# Patient Record
Sex: Male | Born: 1960 | Race: White | Hispanic: No | Marital: Married | Smoking: Current some day smoker
Health system: Southern US, Community
[De-identification: ages and names within clinical notes are randomized; demographics above are authoritative.]

## PROBLEM LIST (undated history)

## (undated) DIAGNOSIS — E669 Obesity, unspecified: Secondary | ICD-10-CM

## (undated) DIAGNOSIS — G4730 Sleep apnea, unspecified: Secondary | ICD-10-CM

## (undated) DIAGNOSIS — M1990 Unspecified osteoarthritis, unspecified site: Secondary | ICD-10-CM

## (undated) DIAGNOSIS — N200 Calculus of kidney: Secondary | ICD-10-CM

## (undated) DIAGNOSIS — I4892 Unspecified atrial flutter: Secondary | ICD-10-CM

## (undated) DIAGNOSIS — Z87442 Personal history of urinary calculi: Secondary | ICD-10-CM

## (undated) DIAGNOSIS — L309 Dermatitis, unspecified: Secondary | ICD-10-CM

## (undated) DIAGNOSIS — Z227 Latent tuberculosis: Secondary | ICD-10-CM

## (undated) HISTORY — PX: LITHOTRIPSY: SUR834

## (undated) HISTORY — PX: HEMANGIOMA EXCISION: SHX1734

## (undated) HISTORY — DX: Calculus of kidney: N20.0

## (undated) HISTORY — DX: Latent tuberculosis: Z22.7

## (undated) HISTORY — DX: Obesity, unspecified: E66.9

## (undated) HISTORY — PX: COLONOSCOPY: SHX174

## (undated) HISTORY — PX: CATARACT EXTRACTION: SUR2

## (undated) HISTORY — DX: Dermatitis, unspecified: L30.9

## (undated) HISTORY — DX: Unspecified atrial flutter: I48.92

---

## 1997-02-16 HISTORY — PX: LAMINECTOMY AND MICRODISCECTOMY LUMBAR SPINE: SHX1913

## 2009-12-30 ENCOUNTER — Encounter (INDEPENDENT_AMBULATORY_CARE_PROVIDER_SITE_OTHER): Payer: Self-pay | Admitting: *Deleted

## 2010-01-07 ENCOUNTER — Encounter (INDEPENDENT_AMBULATORY_CARE_PROVIDER_SITE_OTHER): Payer: Self-pay | Admitting: *Deleted

## 2010-01-08 ENCOUNTER — Ambulatory Visit: Payer: Self-pay | Admitting: Gastroenterology

## 2010-01-20 ENCOUNTER — Ambulatory Visit: Payer: Self-pay | Admitting: Gastroenterology

## 2010-01-23 ENCOUNTER — Encounter: Payer: Self-pay | Admitting: Gastroenterology

## 2010-03-18 NOTE — Procedures (Signed)
Summary: Colonoscopy  Patient: [name] Note: All result statuses are Final unless otherwise noted.  Tests: (1) Colonoscopy (COL)   COL Colonoscopy           DONE     [facility]     [address]     [place], [place]  [address]           COLONOSCOPY PROCEDURE REPORT           PATIENT:  Daniel, Anderson  MR#:  [id number]     BIRTHDATE:  1961-02-02, 49 yrs. old  GENDER:  male     ENDOSCOPIST:  Vania Rea. Jarold Motto, MD, [facility]     REF. BY:  Kari Baars, M.D.     PROCEDURE DATE:  01/20/2010     PROCEDURE:  Colonoscopy with snare polypectomy     ASA CLASS:  Class I     INDICATIONS:  Routine Risk Screening     MEDICATIONS:   Fentanyl 50 mcg IV, Versed 7 mg IV           DESCRIPTION OF PROCEDURE:   After the risks benefits and     alternatives of the procedure were thoroughly explained, informed     consent was obtained.  Digital rectal exam was performed and     revealed no abnormalities.   The LB 180AL [id number] endoscope was     introduced through the anus and advanced to the cecum, which was     identified by both the appendix and ileocecal valve, without     limitations.  The quality of the prep was excellent, using     MoviPrep.  The instrument was then slowly withdrawn as the colon     was fully examined.     <<PROCEDUREIMAGES>>           FINDINGS:  Mild diverticulosis was found in the sigmoid to     descending colon segments.  A sessile polyp was found in the     sigmoid colon. FLAT BENIGN POLYP COLD SNARE EXCISED.     Retroflexed views in the rectum revealed no abnormalities.    The     scope was then withdrawn from the patient and the procedure     completed.           COMPLICATIONS:  None     ENDOSCOPIC IMPRESSION:     1) Mild diverticulosis in the sigmoid to descending colon     segments     2) Sessile polyp in the sigmoid colon     R/O ADENOMA.     RECOMMENDATIONS:     1) high fiber diet     2) Repeat colonoscopy in 5 years if polyp adenomatous;  otherwise     10 years     REPEAT EXAM:  No           ______________________________     Vania Rea. Jarold Motto, MD, [facility]           CC:           n.     eSIGNED:   Vania Rea. Patterson at 01/20/2010 03:46 PM           Jarnigan, Epifania Gore, [id number]  Note: An exclamation mark (!) indicates a result that was not dispersed into the flowsheet. Document Creation Date: 01/20/2010 3:46 PM _______________________________________________________________________  (1) Order result status: Final Collection or observation date-time: 01/20/2010 15:38 Requested date-time:  Receipt date-time:  Reported date-time:  Referring Physician:  Ordering Physician: Sheryn Bison [phone]) Specimen Source:  Source: Launa Grill Order Number: [id number] Lab site:   Appended Document: Colonoscopy ten-year followup  Appended Document: Colonoscopy     Procedures Next Due Date:    Colonoscopy: 01/2020

## 2010-03-18 NOTE — Letter (Signed)
Summary: Pre Visit Letter Revised  [facility]  [address] [place], [place] [address]   Phone: [phone]  Fax: [phone]        12/30/2009 MRN: [id number] [name] [address] [place], [place]  [address]             Procedure Date:  01/20/2010   Welcome to the Gastroenterology Division at [facility].    You are scheduled to see a nurse for your pre-procedure visit on 01/08/2010 at 10:00AM on the 3rd floor at [facility], [address]  We ask that you try to arrive at our office 15 minutes prior to your appointment time to allow for check-in.  Please take a minute to review the attached form.  If you answer "Yes" to one or more of the questions on the first page, we ask that you call the person listed at your earliest opportunity.  If you answer "No" to all of the questions, please complete the rest of the form and bring it to your appointment.    Your nurse visit will consist of discussing your medical and surgical history, your immediate family medical history, and your medications.   If you are unable to list all of your medications on the form, please bring the medication bottles to your appointment and we will list them.  We will need to be aware of both prescribed and over the counter drugs.  We will need to know exact dosage information as well.    Please be prepared to read and sign documents such as consent forms, a financial agreement, and acknowledgement forms.  If necessary, and with your consent, a friend or relative is welcome to sit-in on the nurse visit with you.  Please bring your insurance card so that we may make a copy of it.  If your insurance requires a referral to see a specialist, please bring your referral form from your primary care physician.  No co-pay is required for this nurse visit.     If you cannot keep your appointment, please call [phone] to cancel or reschedule prior to your appointment date.  This  allows [place] the opportunity to schedule an appointment for another patient in need of care.    Thank you for choosing [facility] for your medical needs.  We appreciate the opportunity to care for you.  Please visit [place] at our website  to learn more about our practice.  Sincerely, The Gastroenterology Division

## 2010-03-18 NOTE — Letter (Signed)
Summary: Patient Notice- Polyp Results  [facility]  [address] [place], [place] [address]   Phone: [phone]  Fax: [phone]        January 23, 2010 MRN: [id number]    [name] [address] [place], [place]  [address]    Dear [name],  I am pleased to inform you that the colon polyp(s) removed during your recent colonoscopy was (were) found to be benign (no cancer detected) upon pathologic examination.  I recommend you have a repeat colonoscopy examination in 10_ years to look for recurrent polyps, as having colon polyps increases your risk for having recurrent polyps or even colon cancer in the future.Your polyp was a hyperplastic polyp and not and adenoma.  Should you develop new or worsening symptoms of abdominal pain, bowel habit changes or bleeding from the rectum or bowels, please schedule an evaluation with either your primary care physician or with me.  Additional information/recommendations:  xx__ No further action with gastroenterology is needed at this time. Please      follow-up with your primary care physician for your other healthcare      needs.If You have any gastrointestinal problems, please feel free to call me at your convenience.  __ Please call [phone] to schedule a return visit to review your      situation.  __ Please keep your follow-up visit as already scheduled.  __ Continue treatment plan as outlined the day of your exam.  Please call us if you are having persistent problems or have questions about your condition that have not been fully answered at this time.  Sincerely,  Mardella Layman MD [facility]  This letter has been electronically signed by your physician.  Appended Document: Patient Notice- Polyp Results Letter mailed

## 2010-03-18 NOTE — Miscellaneous (Signed)
Summary: DIR COL...AS.  Clinical Lists Changes  Medications: Added new medication of MOVIPREP 100 GM  SOLR (PEG-KCL-NACL-NASULF-NA ASC-C) As per prep instructions. - Signed Rx of MOVIPREP 100 GM  SOLR (PEG-KCL-NACL-NASULF-NA ASC-C) As per prep instructions.;  #1 x 0;  Signed;  Entered by: Clide Cliff RN;  Authorized by: Mardella Layman MD [facility];  Method used: Electronically to [facility]. [place]. [phone]*, [address] [place], [place], [place]  [address], Ph: [phone] or [phone], Fax: [phone] Observations: Added new observation of ALLERGY REV: Done (01/08/2010 9:43)    Prescriptions: MOVIPREP 100 GM  SOLR (PEG-KCL-NACL-NASULF-NA ASC-C) As per prep instructions.  #1 x 0   Entered by:   Clide Cliff RN   Authorized by:   Mardella Layman MD [facility]   Signed by:   Clide Cliff RN on 01/08/2010   Method used:   Electronically to        [facility]. [address] [phone]* (retail)       [address]       [place], [place]  [address]       Ph: [phone] or [phone]       Fax: [phone]   RxID:   [id number]

## 2010-03-18 NOTE — Letter (Signed)
Summary: [facility] Instructions  [facility]  [address] [place], [place] [address]   Phone: [phone]  Fax: [phone]       MAYSEN SUDOL    1960/12/25    MRN: [id number]        Procedure Day /Date:  Monday 01/20/2010     Arrival Time: 2:30 pm      Procedure Time: 3:30 pm     Location of Procedure:                    _ x_  [facility] (4th Floor)                        PREPARATION FOR COLONOSCOPY WITH MOVIPREP   Starting 5 days prior to your procedure Wednesday 11/30 do not eat nuts, seeds, popcorn, corn, beans, peas,  salads, or any raw vegetables.  Do not take any fiber supplements (e.g. Metamucil, Citrucel, and Benefiber).  THE DAY BEFORE YOUR PROCEDURE         DATE: Sunday 12/4 1.  Drink clear liquids the entire day-NO SOLID FOOD  2.  Do not drink anything colored red or purple.  Avoid juices with pulp.  No orange juice.  3.  Drink at least 64 oz. (8 glasses) of fluid/clear liquids during the day to prevent dehydration and help the prep work efficiently.  CLEAR LIQUIDS INCLUDE: Water Jello Ice Popsicles Tea (sugar ok, no milk/cream) Powdered fruit flavored drinks Coffee (sugar ok, no milk/cream) Gatorade Juice: apple, white grape, white cranberry  Lemonade Clear bullion, consomm, broth Carbonated beverages (any kind) Strained chicken noodle soup Hard Candy                             4.  In the morning, mix first dose of MoviPrep solution:    Empty 1 Pouch A and 1 Pouch B into the disposable container    Add lukewarm drinking water to the top line of the container. Mix to dissolve    Refrigerate (mixed solution should be used within 24 hrs)  5.  Begin drinking the prep at 5:00 p.m. The MoviPrep container is divided by 4 marks.   Every 15 minutes drink the solution down to the next mark (approximately 8 oz) until the full liter is complete.   6.  Follow completed prep with 16 oz of clear liquid of your choice (Nothing  red or purple).  Continue to drink clear liquids until bedtime.  7.  Before going to bed, mix second dose of MoviPrep solution:    Empty 1 Pouch A and 1 Pouch B into the disposable container    Add lukewarm drinking water to the top line of the container. Mix to dissolve    Refrigerate  THE DAY OF YOUR PROCEDURE      DATE: Monday 12/5  Beginning at 10:30 a.m. (5 hours before procedure):         1. Every 15 minutes, drink the solution down to the next mark (approx 8 oz) until the full liter is complete.  2. Follow completed prep with 16 oz. of clear liquid of your choice.    3. You may drink clear liquids until 1:30 pm (2 HOURS BEFORE PROCEDURE).   MEDICATION INSTRUCTIONS  Unless otherwise instructed, you should take regular prescription medications with a small sip of water   as early as possible the morning of your  procedure.           OTHER INSTRUCTIONS  You will need a responsible adult at least 51 years of age to accompany you and drive you home.   This person must remain in the waiting room during your procedure.  Wear loose fitting clothing that is easily removed.  Leave jewelry and other valuables at home.  However, you may wish to bring a book to read or  an iPod/MP3 player to listen to music as you wait for your procedure to start.  Remove all body piercing jewelry and leave at home.  Total time from sign-in until discharge is approximately 2-3 hours.  You should go home directly after your procedure and rest.  You can resume normal activities the  day after your procedure.  The day of your procedure you should not:   Drive   Make legal decisions   Operate machinery   Drink alcohol   Return to work  You will receive specific instructions about eating, activities and medications before you leave.    The above instructions have been reviewed and explained to me by   Clide Cliff, RN_____________________    I fully understand and can  verbalize these instructions _____________________________ Date _________

## 2013-04-17 ENCOUNTER — Other Ambulatory Visit: Payer: Self-pay | Admitting: Dermatology

## 2016-10-05 ENCOUNTER — Ambulatory Visit: Payer: Self-pay | Admitting: Infectious Diseases

## 2016-10-05 ENCOUNTER — Ambulatory Visit: Payer: 59 | Admitting: Infectious Diseases

## 2016-11-11 ENCOUNTER — Ambulatory Visit (INDEPENDENT_AMBULATORY_CARE_PROVIDER_SITE_OTHER): Payer: 59 | Admitting: Infectious Diseases

## 2016-11-11 ENCOUNTER — Encounter: Payer: Self-pay | Admitting: Infectious Diseases

## 2016-11-11 ENCOUNTER — Ambulatory Visit
Admission: RE | Admit: 2016-11-11 | Discharge: 2016-11-11 | Disposition: A | Discharge status: Home / Self Care | Payer: 59 | Source: Ambulatory Visit | Attending: Infectious Diseases | Admitting: Infectious Diseases

## 2016-11-11 VITALS — BP 135/82 | HR 62 | Temp 97.5°F | Ht 70.0 in | Wt 226.0 lb

## 2016-11-11 DIAGNOSIS — R7611 Nonspecific reaction to tuberculin skin test without active tuberculosis: Secondary | ICD-10-CM

## 2016-11-11 DIAGNOSIS — Z227 Latent tuberculosis: Secondary | ICD-10-CM

## 2016-11-11 NOTE — Progress Notes (Signed)
   Subjective:    Patient ID: [name], male    DOB: Aug 06, 1960, 56 y.o.   MRN: [id number]  HPI 56 yo M with hx +PPD 1983 (after deployment in [place]), hyperlipidemia. He had a CXR (-) at that time and subsequently. He is retired from police force.  No hx of TB exposure. Mother with prev PPD+.  Can't remember last CXR (last within 3 years).   The past medical history, family history and social history were reviewed/updated in EPIC   Review of Systems  Constitutional: Negative for appetite change, chills, fever and unexpected weight change.  Respiratory: Negative for cough and shortness of breath.   Cardiovascular: Negative for chest pain.  Gastrointestinal: Negative for constipation and diarrhea.  Genitourinary: Negative for difficulty urinating.  Neurological: Negative for headaches.  Hematological: Negative for adenopathy.  no night sweats Please see HPI. 12 point ROS o/w (-)     Objective:   Physical Exam  Constitutional: He appears well-developed and well-nourished.  HENT:  Mouth/Throat: No oropharyngeal exudate.  Eyes: Pupils are equal, round, and reactive to light. EOM are normal.  Neck: Neck supple.  Cardiovascular: Normal rate, regular rhythm and normal heart sounds.   Pulmonary/Chest: Effort normal and breath sounds normal.  Abdominal: Soft. Bowel sounds are normal. There is no tenderness. There is no rebound.  Musculoskeletal: He exhibits no edema.  Lymphadenopathy:    He has no cervical adenopathy.    He has no axillary adenopathy.       Right: No supraclavicular adenopathy present.       Left: No supraclavicular adenopathy present.      Assessment & Plan:

## 2016-12-21 ENCOUNTER — Encounter: Payer: Self-pay | Admitting: Infectious Diseases

## 2016-12-21 ENCOUNTER — Ambulatory Visit (INDEPENDENT_AMBULATORY_CARE_PROVIDER_SITE_OTHER): Payer: No Typology Code available for payment source | Admitting: Infectious Diseases

## 2016-12-21 VITALS — BP 158/75 | HR 66 | Temp 98.4°F | Wt 228.0 lb

## 2016-12-21 DIAGNOSIS — R7611 Nonspecific reaction to tuberculin skin test without active tuberculosis: Secondary | ICD-10-CM | POA: Diagnosis not present

## 2016-12-21 DIAGNOSIS — Z227 Latent tuberculosis: Secondary | ICD-10-CM

## 2016-12-21 LAB — COMPREHENSIVE METABOLIC PANEL
AG RATIO: 1.6 (calc) (ref 1.0–2.5)
ALBUMIN MSPROF: 4.2 g/dL (ref 3.6–5.1)
ALKALINE PHOSPHATASE (APISO): 78 U/L (ref 40–115)
ALT: 33 U/L (ref 9–46)
AST: 21 U/L (ref 10–35)
BILIRUBIN TOTAL: 0.5 mg/dL (ref 0.2–1.2)
BUN: 23 mg/dL (ref 7–25)
CALCIUM: 9.2 mg/dL (ref 8.6–10.3)
CHLORIDE: 106 mmol/L (ref 98–110)
CO2: 30 mmol/L (ref 20–32)
Creat: 1 mg/dL (ref 0.70–1.33)
GLOBULIN: 2.6 g/dL (ref 1.9–3.7)
Glucose, Bld: 104 mg/dL — ABNORMAL HIGH (ref 65–99)
POTASSIUM: 4.7 mmol/L (ref 3.5–5.3)
SODIUM: 142 mmol/L (ref 135–146)
TOTAL PROTEIN: 6.8 g/dL (ref 6.1–8.1)

## 2016-12-21 MED ORDER — RIFAMPIN 300 MG PO CAPS: 600.0000 mg | ORAL_CAPSULE | Freq: Every day | ORAL | 1 refills | Status: DC

## 2016-12-21 NOTE — Progress Notes (Signed)
   Subjective:    Patient ID: [name], male    DOB: 1961-01-04, 56 y.o.   MRN: [id number]  HPI  56 yo M with hx +PPD 1983 (after deployment in [place]), hyperlipidemia. He had a CXR (-) at that time and subsequently. He is retired from police force.  No hx of TB exposure. Mother with prev PPD+.  Can't remember last CXR (last within 3 years).  He was seen in ID on 9-26 and was equivocal about starting rx for LTBI.  He had CXR (-) at f/u at that time.  He wants to start LTBI rx after first of year.    Review of Systems  Constitutional: Negative for appetite change, chills, fever and unexpected weight change.  Respiratory: Negative for cough and shortness of breath.   Please see HPI. All other systems reviewed and negative.      Objective:   Physical Exam  Constitutional: He appears well-developed and well-nourished.          Assessment & Plan:

## 2017-04-21 ENCOUNTER — Encounter: Payer: Self-pay | Admitting: Infectious Diseases

## 2017-04-21 ENCOUNTER — Ambulatory Visit (INDEPENDENT_AMBULATORY_CARE_PROVIDER_SITE_OTHER): Payer: No Typology Code available for payment source | Admitting: Infectious Diseases

## 2017-04-21 DIAGNOSIS — R7611 Nonspecific reaction to tuberculin skin test without active tuberculosis: Secondary | ICD-10-CM | POA: Diagnosis not present

## 2017-04-21 DIAGNOSIS — Z227 Latent tuberculosis: Secondary | ICD-10-CM | POA: Insufficient documentation

## 2017-04-21 NOTE — Assessment & Plan Note (Addendum)
He is doing well after completing first month of rifampin.  He has no sx of liver dysfunction. Will defer checking his LFTs today. We discussed this.  He is aware of s/s of liver dysfunction.  Explained that his PPD/Quantiferon is unlikely to go back to negative and that if he needs fiurther testing after an exposure, he will need a CXR.  rtc prn.

## 2017-04-21 NOTE — Progress Notes (Signed)
   Subjective:    Patient ID: [name], male    DOB: 1960/08/02, 57 y.o.   MRN: [id number]  HPI 57 yo M with hx +PPD 1983 (after deployment in [place]), hyperlipidemia. He had a CXR (-) at that time and subsequently. He is retired from police force.  No hx of TB exposure. Mother with prev PPD+.  Can't remember last CXR (last within 3 years). He was seen in ID on 9-26 and was equivocal about starting rx for LTBI.  He had CXR (-) at f/u at that time.   Has been doing well, has finnished first month of rifampin.  Only noticed that 1 hr later his urine is off color but otherwise goes back to normal.  No other sfx.   Review of Systems  Constitutional: Negative for appetite change and unexpected weight change.  Gastrointestinal: Negative for abdominal distention, abdominal pain, constipation, diarrhea, nausea and vomiting.  no change in color of stool, or change in color of eyes.       Objective:   Physical Exam  Constitutional: He appears well-developed and well-nourished.  HENT:  Mouth/Throat: No oropharyngeal exudate.  Eyes: EOM are normal. Pupils are equal, round, and reactive to light.  Neck: Neck supple.  Cardiovascular: Normal rate, regular rhythm and normal heart sounds.  Pulmonary/Chest: Effort normal and breath sounds normal.  Abdominal: Soft. Bowel sounds are normal. There is no tenderness. There is no rebound.  Musculoskeletal: He exhibits no edema.  Lymphadenopathy:    He has no cervical adenopathy.       Assessment & Plan:

## 2017-06-10 ENCOUNTER — Other Ambulatory Visit: Payer: Self-pay

## 2017-06-10 ENCOUNTER — Telehealth: Payer: Self-pay

## 2017-06-10 NOTE — Telephone Encounter (Signed)
Refill request for Rifampin came in on fax. Please advise. Pola Corn, LPN

## 2017-06-10 NOTE — Telephone Encounter (Signed)
Please refill  thanks

## 2018-01-31 ENCOUNTER — Ambulatory Visit (INDEPENDENT_AMBULATORY_CARE_PROVIDER_SITE_OTHER): Payer: No Typology Code available for payment source

## 2018-01-31 ENCOUNTER — Encounter (INDEPENDENT_AMBULATORY_CARE_PROVIDER_SITE_OTHER): Payer: Self-pay | Admitting: Orthopaedic Surgery

## 2018-01-31 ENCOUNTER — Ambulatory Visit (INDEPENDENT_AMBULATORY_CARE_PROVIDER_SITE_OTHER): Payer: No Typology Code available for payment source | Admitting: Orthopaedic Surgery

## 2018-01-31 DIAGNOSIS — M25551 Pain in right hip: Secondary | ICD-10-CM

## 2018-01-31 DIAGNOSIS — M5431 Sciatica, right side: Secondary | ICD-10-CM

## 2018-01-31 MED ORDER — METHYLPREDNISOLONE ACETATE 40 MG/ML IJ SUSP: 40.0000 mg | Freq: Once | INTRAMUSCULAR | Status: DC

## 2018-01-31 NOTE — Progress Notes (Signed)
[name] comes in today for evaluation treatment of right hip pain.  He says both hips are stiff the right is been hurting in the back of the hip and in the groin.  There is some radiation down to the knee but nothing past the knee into the foot or ankle.  He had some stiffness in his back as well.  This is been slowly getting worse over time but the last for 5 weeks it is been more significantly worse in terms of pain in the groin without any known injury.  He has been very active all his life.  We have performed surgery on the shoulder before and injected his shoulder which he got significant relief from.  He says both hips are stiff but the right is worse than left.  There is a history of osteoarthritis in his family and siblings and parents have had joint replacements.  He has more problems getting out of a car and pivoting and putting on his shoe and sock on the right side.  On exam both hips have significant stiffness with internal X rotation.  The right hip has more pain in the groin and around the side of the hip with internal extra rotation and there is no pain over the trochanteric area to palpation.  He has negative straight leg raise bilaterally and excellent motor function and sensory function in both lower extremities.  AP pelvis and lateral of his right hip does show evidence of femoral acetabular impingement.  There are particular osteophytes as well as a humpback deformity of the femoral head neck area on the right side and somewhat on the left side.  There is superior lateral joint space narrowing and sclerotic changes in the acetabulum.  2 views of lumbar spine show arthritic changes at several levels but no acute findings.  I have talked to my partner Dr. Junius Roads who is proficient ultrasound about seeing the patient today for an ultrasound-guided intra-articular injection in the right hip.  We will see him back in 4 weeks to see if this is helped from a therapeutic and diagnostic standpoint.   All question concerns were answered and addressed.

## 2018-01-31 NOTE — Progress Notes (Signed)
Subjective: He is here for ultrasound-guided right hip injection.  He has femoral acetabular impingement.  Pain mainly with walking, but in the past few weeks it has been hurting fairly constantly.  Objective: Limited passive range of motion with internal rotation in both hips.  Mild pain on the right.  Tender near the posterior greater trochanter as well.  Procedure: Ultrasound-guided right hip injection: After sterile prep with Betadine, injected 8 cc 1% lidocaine without epinephrine and 40 mg methylprednisolone using a 22-gauge spinal needle, passing the needle through the iliofemoral ligament into the femoral head/neck junction.  Injectate was seen filling the joint capsule.  Patient had good immediate pain relief during the anesthetic phase.  He will follow-up in 4 weeks with Dr. [name].

## 2018-02-28 ENCOUNTER — Ambulatory Visit (INDEPENDENT_AMBULATORY_CARE_PROVIDER_SITE_OTHER): Payer: No Typology Code available for payment source | Admitting: Orthopaedic Surgery

## 2018-02-28 ENCOUNTER — Encounter (INDEPENDENT_AMBULATORY_CARE_PROVIDER_SITE_OTHER): Payer: Self-pay | Admitting: Orthopaedic Surgery

## 2018-02-28 DIAGNOSIS — M5431 Sciatica, right side: Secondary | ICD-10-CM

## 2018-02-28 DIAGNOSIS — M25551 Pain in right hip: Secondary | ICD-10-CM | POA: Diagnosis not present

## 2018-02-28 NOTE — Progress Notes (Signed)
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  He says the injection gave him significant relief.  He still has some pain with certain motions but overall he is feeling better.  On exam he does still show evidence of femoral sterile impingement with pain in the groin on extremes of internal and external rotation of his right hip.  This point we will follow-up as needed.  We talked in detail about avoiding high impact aerobic activities.  He can always consider a repeat steroid injection but not for least 5 to 6 months.  The other possibility would be hip replacement if the pain gets to where it is detrimentally affecting his mobility, his quality of life, and his activities daily living on a daily basis.  That is when it would be time to have a hip replacement.  All question concerns were answered and addressed.  Follow-up otherwise be as needed.

## 2018-11-28 ENCOUNTER — Other Ambulatory Visit: Payer: Self-pay

## 2018-11-28 ENCOUNTER — Ambulatory Visit (INDEPENDENT_AMBULATORY_CARE_PROVIDER_SITE_OTHER): Payer: No Typology Code available for payment source | Admitting: Orthopedic Surgery

## 2018-11-28 ENCOUNTER — Other Ambulatory Visit: Payer: Self-pay | Admitting: Surgical

## 2018-11-28 ENCOUNTER — Encounter (HOSPITAL_COMMUNITY): Payer: Self-pay | Admitting: *Deleted

## 2018-11-28 ENCOUNTER — Other Ambulatory Visit (HOSPITAL_COMMUNITY)
Admission: RE | Admit: 2018-11-28 | Discharge: 2018-11-28 | Disposition: A | Discharge status: Home / Self Care | Payer: BLUE CROSS/BLUE SHIELD | Source: Ambulatory Visit | Attending: Orthopedic Surgery | Admitting: Orthopedic Surgery

## 2018-11-28 ENCOUNTER — Encounter: Payer: Self-pay | Admitting: Orthopedic Surgery

## 2018-11-28 VITALS — Ht 70.0 in | Wt 225.0 lb

## 2018-11-28 DIAGNOSIS — S42291A Other displaced fracture of upper end of right humerus, initial encounter for closed fracture: Secondary | ICD-10-CM

## 2018-11-28 DIAGNOSIS — Z20828 Contact with and (suspected) exposure to other viral communicable diseases: Secondary | ICD-10-CM | POA: Diagnosis not present

## 2018-11-28 MED ORDER — OXYCODONE HCL 5 MG PO TABS: 5.0000 mg | ORAL_TABLET | Freq: Four times a day (QID) | ORAL | 0 refills | Status: DC | PRN

## 2018-11-28 MED ORDER — METHOCARBAMOL 500 MG PO TABS: 500.0000 mg | ORAL_TABLET | Freq: Three times a day (TID) | ORAL | 0 refills | Status: DC | PRN

## 2018-11-28 MED ORDER — ASPIRIN EC 81 MG PO TBEC: 81.0000 mg | DELAYED_RELEASE_TABLET | Freq: Every day | ORAL | 0 refills | Status: DC

## 2018-11-28 NOTE — Progress Notes (Signed)
Pre-op orders are now in. I called pt and instructed him to drink clear liquids until 12 noon (3 hours prior to surgery). Pt or his wife will try to come in the AM and pick up a Pre-Surgery Ensure drink.
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
## 2018-11-28 NOTE — Progress Notes (Signed)
Spoke with pt for pre-op call. Pt denies cardiac history, HTN or Diabetes.  Pt had Covid test done today and voices understanding of quarantine instructions.  No pre-op orders in Epic when I made this call. I did tell him if orders were put in this afternoon I may be calling him about being able to drink clear liquids tomorrow, but for now he is to be NPO after midnight. He voiced understanding.

## 2018-11-29 ENCOUNTER — Ambulatory Visit (HOSPITAL_COMMUNITY): Payer: BLUE CROSS/BLUE SHIELD | Admitting: Anesthesiology

## 2018-11-29 ENCOUNTER — Ambulatory Visit (HOSPITAL_COMMUNITY)
Admission: RE | Admit: 2018-11-29 | Discharge: 2018-11-29 | Disposition: A | Discharge status: Home / Self Care | Payer: BLUE CROSS/BLUE SHIELD | Attending: Orthopedic Surgery | Admitting: Orthopedic Surgery

## 2018-11-29 ENCOUNTER — Ambulatory Visit (HOSPITAL_COMMUNITY): Payer: BLUE CROSS/BLUE SHIELD

## 2018-11-29 ENCOUNTER — Encounter (HOSPITAL_COMMUNITY): Payer: Self-pay

## 2018-11-29 ENCOUNTER — Encounter (HOSPITAL_COMMUNITY)
Admission: RE | Disposition: A | Discharge status: Home / Self Care | Payer: Self-pay | Source: Home / Self Care | Attending: Orthopedic Surgery

## 2018-11-29 DIAGNOSIS — S42291D Other displaced fracture of upper end of right humerus, subsequent encounter for fracture with routine healing: Secondary | ICD-10-CM

## 2018-11-29 DIAGNOSIS — F17290 Nicotine dependence, other tobacco product, uncomplicated: Secondary | ICD-10-CM | POA: Insufficient documentation

## 2018-11-29 DIAGNOSIS — S42201A Unspecified fracture of upper end of right humerus, initial encounter for closed fracture: Secondary | ICD-10-CM | POA: Diagnosis not present

## 2018-11-29 DIAGNOSIS — X58XXXA Exposure to other specified factors, initial encounter: Secondary | ICD-10-CM | POA: Insufficient documentation

## 2018-11-29 DIAGNOSIS — Z419 Encounter for procedure for purposes other than remedying health state, unspecified: Secondary | ICD-10-CM

## 2018-11-29 DIAGNOSIS — Z227 Latent tuberculosis: Secondary | ICD-10-CM | POA: Insufficient documentation

## 2018-11-29 HISTORY — DX: Unspecified osteoarthritis, unspecified site: M19.90

## 2018-11-29 HISTORY — PX: ORIF SHOULDER FRACTURE: SHX5035

## 2018-11-29 HISTORY — DX: Personal history of urinary calculi: Z87.442

## 2018-11-29 LAB — BASIC METABOLIC PANEL
Anion gap: 9 (ref 5–15)
BUN: 13 mg/dL (ref 6–20)
CO2: 25 mmol/L (ref 22–32)
Calcium: 8.2 mg/dL — ABNORMAL LOW (ref 8.9–10.3)
Chloride: 104 mmol/L (ref 98–111)
Creatinine, Ser: 0.79 mg/dL (ref 0.61–1.24)
GFR calc Af Amer: >60 mL/min (ref >60)
GFR calc non Af Amer: >60 mL/min (ref >60)
Glucose, Bld: 139 mg/dL — ABNORMAL HIGH (ref 70–99)
Potassium: 3.5 mmol/L (ref 3.5–5.1)
Sodium: 138 mmol/L (ref 135–145)

## 2018-11-29 LAB — CBC
HCT: 36.3 % — ABNORMAL LOW (ref 39.0–52.0)
Hemoglobin: 12.1 g/dL — ABNORMAL LOW (ref 13.0–17.0)
MCH: 29.5 pg (ref 26.0–34.0)
MCHC: 33.3 g/dL (ref 30.0–36.0)
MCV: 88.5 fL (ref 80.0–100.0)
Platelets: 176 10*3/uL (ref 150–400)
RBC: 4.1 MIL/uL — ABNORMAL LOW (ref 4.22–5.81)
RDW: 13.2 % (ref 11.5–15.5)
WBC: 8.1 10*3/uL (ref 4.0–10.5)
nRBC: 0 % (ref 0.0–0.2)

## 2018-11-29 LAB — SARS CORONAVIRUS 2 (TAT 6-24 HRS): SARS Coronavirus 2: NEGATIVE

## 2018-11-29 SURGERY — OPEN REDUCTION INTERNAL FIXATION (ORIF) SHOULDER FRACTURE
Anesthesia: Regional | Site: Shoulder | Laterality: Right

## 2018-11-29 MED ORDER — FENTANYL CITRATE (PF) 100 MCG/2ML IJ SOLN
INTRAMUSCULAR | Status: AC
  Administered 2018-11-29: 100 ug via INTRAVENOUS
  Filled 2018-11-29: qty 2

## 2018-11-29 MED ORDER — ONDANSETRON HCL 4 MG/2ML IJ SOLN
INTRAMUSCULAR | Status: AC
  Filled 2018-11-29: qty 2

## 2018-11-29 MED ORDER — LACTATED RINGERS IV SOLN
INTRAVENOUS | Status: DC
  Administered 2018-11-29 (×2): via INTRAVENOUS

## 2018-11-29 MED ORDER — CEFAZOLIN SODIUM-DEXTROSE 2-4 GM/100ML-% IV SOLN
2.0000 g | INTRAVENOUS | Status: AC
  Administered 2018-11-29: 2 g via INTRAVENOUS
  Filled 2018-11-29: qty 100

## 2018-11-29 MED ORDER — PHENYLEPHRINE HCL (PRESSORS) 10 MG/ML IV SOLN
INTRAVENOUS | Status: AC
  Filled 2018-11-29: qty 1

## 2018-11-29 MED ORDER — OXYCODONE HCL 5 MG PO TABS: 5.0000 mg | ORAL_TABLET | Freq: Once | ORAL | Status: DC | PRN

## 2018-11-29 MED ORDER — LIDOCAINE 2% (20 MG/ML) 5 ML SYRINGE
INTRAMUSCULAR | Status: AC
  Filled 2018-11-29: qty 5

## 2018-11-29 MED ORDER — ROCURONIUM BROMIDE 10 MG/ML (PF) SYRINGE
PREFILLED_SYRINGE | INTRAVENOUS | Status: DC | PRN
  Administered 2018-11-29: 30 mg via INTRAVENOUS
  Administered 2018-11-29: 20 mg via INTRAVENOUS
  Administered 2018-11-29 (×3): 30 mg via INTRAVENOUS
  Administered 2018-11-29: 50 mg via INTRAVENOUS

## 2018-11-29 MED ORDER — ONDANSETRON HCL 4 MG/2ML IJ SOLN
INTRAMUSCULAR | Status: DC | PRN
  Administered 2018-11-29: 4 mg via INTRAVENOUS

## 2018-11-29 MED ORDER — TRANEXAMIC ACID-NACL 1000-0.7 MG/100ML-% IV SOLN
INTRAVENOUS | Status: DC | PRN
  Administered 2018-11-29: 1000 mg via INTRAVENOUS

## 2018-11-29 MED ORDER — FENTANYL CITRATE (PF) 100 MCG/2ML IJ SOLN
100.0000 ug | Freq: Once | INTRAMUSCULAR | Status: AC
  Administered 2018-11-29: 14:00:00 100 ug via INTRAVENOUS
  Filled 2018-11-29: qty 2

## 2018-11-29 MED ORDER — VANCOMYCIN HCL 1000 MG IV SOLR
INTRAVENOUS | Status: AC
  Filled 2018-11-29: qty 1000

## 2018-11-29 MED ORDER — SODIUM CHLORIDE 0.9 % IV SOLN
INTRAVENOUS | Status: DC | PRN
  Administered 2018-11-29: 25 ug/min via INTRAVENOUS
  Administered 2018-11-29: 19:00:00 via INTRAVENOUS

## 2018-11-29 MED ORDER — ALBUMIN HUMAN 5 % IV SOLN
INTRAVENOUS | Status: DC | PRN
  Administered 2018-11-29: 18:00:00 via INTRAVENOUS

## 2018-11-29 MED ORDER — CHLORHEXIDINE GLUCONATE 4 % EX LIQD: 60.0000 mL | Freq: Once | CUTANEOUS | Status: DC

## 2018-11-29 MED ORDER — TRANEXAMIC ACID-NACL 1000-0.7 MG/100ML-% IV SOLN
INTRAVENOUS | Status: AC
  Filled 2018-11-29: qty 100

## 2018-11-29 MED ORDER — FENTANYL CITRATE (PF) 250 MCG/5ML IJ SOLN
INTRAMUSCULAR | Status: AC
  Filled 2018-11-29: qty 5

## 2018-11-29 MED ORDER — MIDAZOLAM HCL 2 MG/2ML IJ SOLN
2.0000 mg | Freq: Once | INTRAMUSCULAR | Status: AC
  Administered 2018-11-29: 14:00:00 2 mg via INTRAVENOUS
  Filled 2018-11-29: qty 2

## 2018-11-29 MED ORDER — PROPOFOL 10 MG/ML IV BOLUS
INTRAVENOUS | Status: AC
  Filled 2018-11-29: qty 20

## 2018-11-29 MED ORDER — FENTANYL CITRATE (PF) 100 MCG/2ML IJ SOLN: 25.0000 ug | INTRAMUSCULAR | Status: DC | PRN

## 2018-11-29 MED ORDER — DEXAMETHASONE SODIUM PHOSPHATE 10 MG/ML IJ SOLN
INTRAMUSCULAR | Status: DC | PRN
  Administered 2018-11-29: 10 mg via INTRAVENOUS

## 2018-11-29 MED ORDER — SUGAMMADEX SODIUM 200 MG/2ML IV SOLN
INTRAVENOUS | Status: DC | PRN
  Administered 2018-11-29: 200 mg via INTRAVENOUS

## 2018-11-29 MED ORDER — EPHEDRINE SULFATE-NACL 50-0.9 MG/10ML-% IV SOSY
PREFILLED_SYRINGE | INTRAVENOUS | Status: DC | PRN
  Administered 2018-11-29: 10 mg via INTRAVENOUS

## 2018-11-29 MED ORDER — OXYCODONE HCL 5 MG/5ML PO SOLN: 5.0000 mg | Freq: Once | ORAL | Status: DC | PRN

## 2018-11-29 MED ORDER — MIDAZOLAM HCL 2 MG/2ML IJ SOLN
INTRAMUSCULAR | Status: AC
  Administered 2018-11-29: 2 mg via INTRAVENOUS
  Filled 2018-11-29: qty 2

## 2018-11-29 MED ORDER — ROCURONIUM BROMIDE 10 MG/ML (PF) SYRINGE
PREFILLED_SYRINGE | INTRAVENOUS | Status: AC
  Filled 2018-11-29: qty 10

## 2018-11-29 MED ORDER — VANCOMYCIN HCL 1000 MG IV SOLR
INTRAVENOUS | Status: DC | PRN
  Administered 2018-11-29: 1000 mg via TOPICAL

## 2018-11-29 MED ORDER — GLYCOPYRROLATE PF 0.2 MG/ML IJ SOSY
PREFILLED_SYRINGE | INTRAMUSCULAR | Status: AC
  Filled 2018-11-29: qty 1

## 2018-11-29 MED ORDER — ONDANSETRON HCL 4 MG/2ML IJ SOLN
4.0000 mg | Freq: Once | INTRAMUSCULAR | Status: AC | PRN
  Administered 2018-11-29: 4 mg via INTRAVENOUS

## 2018-11-29 MED ORDER — DEXAMETHASONE SODIUM PHOSPHATE 10 MG/ML IJ SOLN
INTRAMUSCULAR | Status: AC
  Filled 2018-11-29: qty 2

## 2018-11-29 MED ORDER — 0.9 % SODIUM CHLORIDE (POUR BTL) OPTIME
TOPICAL | Status: DC | PRN
  Administered 2018-11-29 (×5): 1000 mL

## 2018-11-29 MED ORDER — PROPOFOL 10 MG/ML IV BOLUS
INTRAVENOUS | Status: DC | PRN
  Administered 2018-11-29: 170 mg via INTRAVENOUS

## 2018-11-29 MED ORDER — STERILE WATER FOR IRRIGATION IR SOLN
Status: DC | PRN
  Administered 2018-11-29: 1000 mL

## 2018-11-29 MED ORDER — LIDOCAINE 2% (20 MG/ML) 5 ML SYRINGE
INTRAMUSCULAR | Status: DC | PRN
  Administered 2018-11-29: 40 mg via INTRAVENOUS

## 2018-11-29 SURGICAL SUPPLY — 81 items
ALCOHOL 70% 16 OZ (MISCELLANEOUS) ×3 IMPLANT
BENZOIN TINCTURE PRP APPL 2/3 (GAUZE/BANDAGES/DRESSINGS) IMPLANT
BIT DRILL 3.2 (BIT) ×2
BIT DRILL 3.2XCALB NS DISP (BIT) ×1 IMPLANT
BIT DRILL 5/64X5 DISP (BIT) ×3 IMPLANT
BIT DRILL CALIBRATED 2.7 (BIT) ×2 IMPLANT
BIT DRILL CALIBRATED 2.7MM (BIT) ×1
BIT DRL 3.2XCALB NS DISP (BIT) ×1
BNDG COHESIVE 4X5 TAN STRL (GAUZE/BANDAGES/DRESSINGS) ×6 IMPLANT
CANISTER SUCTION 1500CC (MISCELLANEOUS) ×6 IMPLANT
CHLORAPREP W/TINT 26 (MISCELLANEOUS) ×6 IMPLANT
CLOSURE STERI-STRIP 1/2X4 (GAUZE/BANDAGES/DRESSINGS) ×1
CLSR STERI-STRIP ANTIMIC 1/2X4 (GAUZE/BANDAGES/DRESSINGS) ×2 IMPLANT
COVER SURGICAL LIGHT HANDLE (MISCELLANEOUS) ×3 IMPLANT
COVER WAND RF STERILE (DRAPES) IMPLANT
DRAPE C-ARM 42X72 X-RAY (DRAPES) ×3 IMPLANT
DRAPE IMP U-DRAPE 54X76 (DRAPES) ×3 IMPLANT
DRAPE U-SHAPE 47X51 STRL (DRAPES) ×6 IMPLANT
DRESSING AQUACEL AG SP 3.5X10 (GAUZE/BANDAGES/DRESSINGS) ×1 IMPLANT
DRSG AQUACEL AG ADV 3.5X 6 (GAUZE/BANDAGES/DRESSINGS) IMPLANT
DRSG AQUACEL AG SP 3.5X10 (GAUZE/BANDAGES/DRESSINGS) ×3
DRSG PAD ABDOMINAL 8X10 ST (GAUZE/BANDAGES/DRESSINGS) IMPLANT
ELECT REM PT RETURN 9FT ADLT (ELECTROSURGICAL) ×3
ELECTRODE REM PT RTRN 9FT ADLT (ELECTROSURGICAL) ×1 IMPLANT
GAUZE SPONGE 4X4 12PLY STRL (GAUZE/BANDAGES/DRESSINGS) IMPLANT
GAUZE XEROFORM 5X9 LF (GAUZE/BANDAGES/DRESSINGS) IMPLANT
GLOVE BIOGEL PI IND STRL 6.5 (GLOVE) ×2 IMPLANT
GLOVE BIOGEL PI IND STRL 8 (GLOVE) ×1 IMPLANT
GLOVE BIOGEL PI INDICATOR 6.5 (GLOVE) ×4
GLOVE BIOGEL PI INDICATOR 8 (GLOVE) ×2
GLOVE SURG ORTHO 8.0 STRL STRW (GLOVE) ×3 IMPLANT
GLOVE SURG SS PI 6.5 STRL IVOR (GLOVE) ×9 IMPLANT
GOWN STRL REUS W/ TWL LRG LVL3 (GOWN DISPOSABLE) ×2 IMPLANT
GOWN STRL REUS W/ TWL XL LVL3 (GOWN DISPOSABLE) ×1 IMPLANT
GOWN STRL REUS W/TWL LRG LVL3 (GOWN DISPOSABLE) ×4
GOWN STRL REUS W/TWL XL LVL3 (GOWN DISPOSABLE) ×2
HYDROGEN PEROXIDE 16OZ (MISCELLANEOUS) ×3 IMPLANT
K-WIRE 2X5 SS THRDED S3 (WIRE) ×9
KIT BASIN OR (CUSTOM PROCEDURE TRAY) ×3 IMPLANT
KIT TURNOVER KIT B (KITS) ×3 IMPLANT
KWIRE 2X5 SS THRDED S3 (WIRE) ×3 IMPLANT
MANIFOLD NEPTUNE II (INSTRUMENTS) ×3 IMPLANT
NDL SUT 6 .5 CRC .975X.05 MAYO (NEEDLE) ×1 IMPLANT
NEEDLE 1/2 CIR MAYO (NEEDLE) ×3 IMPLANT
NEEDLE MAYO TAPER (NEEDLE) ×2
NEEDLE SCORPION MULTI FIRE (NEEDLE) ×3 IMPLANT
NS IRRIG 1000ML POUR BTL (IV SOLUTION) ×3 IMPLANT
PACK SHOULDER (CUSTOM PROCEDURE TRAY) ×3 IMPLANT
PAD ABD 7.5X8 STRL (GAUZE/BANDAGES/DRESSINGS) IMPLANT
PAD ARMBOARD 7.5X6 YLW CONV (MISCELLANEOUS) ×3 IMPLANT
PEG LOCKING 3.2MMX44 (Peg) ×3 IMPLANT
PEG LOCKING 3.2MMX54 (Peg) ×3 IMPLANT
PEG LOCKING 3.2MMX56 (Peg) ×3 IMPLANT
PEG LOCKING 3.2X34 (Screw) ×3 IMPLANT
PEG LOCKING 3.2X36 (Screw) ×3 IMPLANT
PEG LOCKING 3.2X38 (Screw) ×3 IMPLANT
PEG LOCKING 3.2X42 (Screw) ×3 IMPLANT
PEG LOCKING 3.2X50 (Screw) ×3 IMPLANT
PLATE PROX HUM HI R 3H 80 (Plate) ×3 IMPLANT
SCREW LOCK CORT STAR 3.5X42 (Screw) ×3 IMPLANT
SCREW LOW PROF TIS 3.5X28MM (Screw) ×3 IMPLANT
SCREW LP NL T15 3.5X26 (Screw) ×6 IMPLANT
SCREW T15 LP CORT 3.5X46MM NS (Screw) ×3 IMPLANT
SLEEVE MEASURING 3.2 (BIT) ×3 IMPLANT
SPONGE LAP 18X18 RF (DISPOSABLE) ×3 IMPLANT
SPONGE LAP 18X18 X RAY DECT (DISPOSABLE) ×3 IMPLANT
SPONGE LAP 4X18 RFD (DISPOSABLE) IMPLANT
STAPLER VISISTAT 35W (STAPLE) IMPLANT
SUCTION FRAZIER HANDLE 10FR (MISCELLANEOUS) ×2
SUCTION TUBE FRAZIER 10FR DISP (MISCELLANEOUS) ×1 IMPLANT
SUT 0 FIBERLOOP 38 BLUE TPR ND (SUTURE) ×12
SUT BROADBAND TAPE 2PK 1.5 (SUTURE) ×6 IMPLANT
SUT MNCRL AB 3-0 PS2 27 (SUTURE) ×6 IMPLANT
SUT VIC AB 2-0 CTB1 (SUTURE) IMPLANT
SUTURE 0 FIBERLP 38 BLU TPR ND (SUTURE) ×4 IMPLANT
SUTURE TAPE 1.3 40 TPR END (SUTURE) ×1 IMPLANT
SUTURETAPE 1.3 40 TPR END (SUTURE) ×3
TOWEL GREEN STERILE (TOWEL DISPOSABLE) ×3 IMPLANT
TOWEL GREEN STERILE FF (TOWEL DISPOSABLE) ×3 IMPLANT
WATER STERILE IRR 1000ML POUR (IV SOLUTION) ×3 IMPLANT
YANKAUER SUCT BULB TIP NO VENT (SUCTIONS) ×3 IMPLANT

## 2018-11-29 NOTE — Transfer of Care (Signed)
Immediate Anesthesia Transfer of Care Note  Patient: [name]  Procedure(s) Performed: RIGHT OPEN REDUCTION INTERNAL FIXATION (ORIF) SHOULDER FRACTURE (Right Shoulder)  Patient Location: PACU  Anesthesia Type:GA combined with regional for post-op pain  Level of Consciousness: awake, oriented and sedated  Airway & Oxygen Therapy: Patient connected to nasal cannula oxygen  Post-op Assessment: Report given to RN and Post -op Vital signs reviewed and stable  Post vital signs: Reviewed and stable  Last Vitals:  Vitals Value Taken Time  BP 137/80 11/29/18 2000  Temp    Pulse 96 11/29/18 2001  Resp 30 11/29/18 2001  SpO2 91 % 11/29/18 2001  Vitals shown include unvalidated device data.  Last Pain:  Vitals:   11/29/18 2000  TempSrc:   PainSc: (P) 0-No pain      Patients Stated Pain Goal: 0 ([id number])  Complications: No apparent anesthesia complications

## 2018-11-29 NOTE — Anesthesia Procedure Notes (Addendum)
Anesthesia Regional Block: Interscalene brachial plexus block   Pre-Anesthetic Checklist: ,, timeout performed, Correct Patient, Correct Site, Correct Laterality, Correct Procedure, Correct Position, site marked, Risks and benefits discussed,  Surgical consent,  Pre-op evaluation,  At surgeon's request and post-op pain management  Laterality: Right  Prep: chloraprep       Needles:  Injection technique: Single-shot  Needle Type: Stimulator Needle - 40      Needle Gauge: 22     Additional Needles:   Procedures:, nerve stimulator,,,,,,,  Narrative:  Start time: 11/29/2018 2:20 PM End time: 11/29/2018 2:30 PM Injection made incrementally with aspirations every 5 mL.  Performed by: Personally   Additional Notes: 15 cc 0.75% Bupivacaine 1:200 Epi injected easily 10 cc 1.3% Exparel injected easily

## 2018-11-29 NOTE — H&P (Signed)
[name] is an 58 y.o. male.   Chief Complaint: Right shoulder pain HPI: [name] is a 58 year old patient who injured his right shoulder 2 days ago.  Sustained a proximal humerus fracture dislocation which was reduced.  CT scan demonstrates good alignment of the shaft with the humeral head.  Greater tuberosity fragment piece is displaced superiorly and posteriorly.  Displaced enough that it would interfere with motion work to be left in that position.  Denies any other orthopedic complaints.  He is left-hand dominant but does a lot of things with his right arm.  Past Medical History:  Diagnosis Date  . Arthritis    back  . History of kidney stones   . LTBI (latent tuberculosis infection)    treated (latent)    Past Surgical History:  Procedure Laterality Date  . COLONOSCOPY    . HEMANGIOMA EXCISION     Dr. [name]  . LAMINECTOMY AND MICRODISCECTOMY LUMBAR SPINE  1999   L5-S1    Family History  Problem Relation Age of Onset  . Other Mother        latent TB  . Rheum arthritis Mother   . Heart failure Father   . Renal Disease Father    Social History:  reports that he has been smoking cigars. He has never used smokeless tobacco. He reports current alcohol use of about 2.0 standard drinks of alcohol per week. He reports that he does not use drugs.  Allergies: No Known Allergies  Facility-Administered Medications Prior to Admission  Medication Dose Route Frequency Provider Last Rate Last Dose  . methylPREDNISolone acetate (DEPO-MEDROL) injection 40 mg  40 mg Intra-articular Once Hilts, Michael, MD       Medications Prior to Admission  Medication Sig Dispense Refill  . HYDROcodone-acetaminophen (NORCO/VICODIN) 5-325 MG tablet Take 2 tablets by mouth every 6 (six) hours as needed for severe pain.     . meloxicam (MOBIC) 15 MG tablet Take 15 mg by mouth daily as needed for pain.     . metaxalone (SKELAXIN) 800 MG tablet Take 800 mg by mouth 3 (three) times daily as needed for  muscle spasms.     Marland Kitchen olopatadine (PATANOL) 0.1 % ophthalmic solution Place 1 drop into both eyes 2 (two) times daily as needed for allergies.     Marland Kitchen psyllium (METAMUCIL) 28 % packet Take 1 packet by mouth 2 (two) times daily.    Marland Kitchen triamcinolone (NASACORT ALLERGY 24HR) 55 MCG/ACT AERO nasal inhaler Place 2 sprays into the nose daily as needed (allergies).    . zolpidem (AMBIEN) 10 MG tablet Take 10 mg by mouth at bedtime as needed for sleep.    Marland Kitchen aspirin EC 81 MG tablet Take 1 tablet (81 mg total) by mouth daily. 14 tablet 0  . Crisaborole (EUCRISA) 2 % OINT Apply 1 application topically 2 (two) times daily as needed (eczema).     . methocarbamol (ROBAXIN) 500 MG tablet Take 1 tablet (500 mg total) by mouth every 8 (eight) hours as needed for muscle spasms. 30 tablet 0  . oxyCODONE (ROXICODONE) 5 MG immediate release tablet Take 1 tablet (5 mg total) by mouth every 6 (six) hours as needed. 35 tablet 0    Results for orders placed or performed during the hospital encounter of 11/29/18 (from the past 48 hour(s))  CBC     Status: Abnormal   Collection Time: 11/29/18  1:44 PM  Result Value Ref Range   WBC 8.1 4.0 - 10.5 K/uL  RBC 4.10 (L) 4.22 - 5.81 MIL/uL   Hemoglobin 12.1 (L) 13.0 - 17.0 g/dL   HCT 36.3 (L) 39.0 - 52.0 %   MCV 88.5 80.0 - 100.0 fL   MCH 29.5 26.0 - 34.0 pg   MCHC 33.3 30.0 - 36.0 g/dL   RDW 13.2 11.5 - 15.5 %   Platelets 176 150 - 400 K/uL   nRBC 0.0 0.0 - 0.2 %    Comment: Performed at [facility] [address] [place], [place] [id number]  Basic metabolic panel     Status: Abnormal   Collection Time: 11/29/18  1:44 PM  Result Value Ref Range   Sodium 138 135 - 145 mmol/L   Potassium 3.5 3.5 - 5.1 mmol/L   Chloride 104 98 - 111 mmol/L   CO2 25 22 - 32 mmol/L   Glucose, Bld 139 (H) 70 - 99 mg/dL   BUN 13 6 - 20 mg/dL   Creatinine, Ser 0.79 0.61 - 1.24 mg/dL   Calcium 8.2 (L) 8.9 - 10.3 mg/dL   GFR calc non Af Amer >60 >60 mL/min   GFR calc Af Amer >60 >60  mL/min   Anion gap 9 5 - 15    Comment: Performed at [facility], [place] [address], [place],  [address]   No results found.  Review of Systems  Musculoskeletal: Positive for joint pain.  All other systems reviewed and are negative.   Blood pressure (!) 141/72, pulse 71, temperature 98.8 F (37.1 C), temperature source Oral, resp. rate (!) 22, height 5\' 10"  (1.778 m), weight 102.1 kg, SpO2 100 %. Physical Exam  Constitutional: He appears well-developed.  HENT:  Head: Normocephalic.  Eyes: Pupils are equal, round, and reactive to light.  Neck: Normal range of motion.  Cardiovascular: Normal rate.  Respiratory: Effort normal.  Neurological: He is alert.  Skin: Skin is warm.  Psychiatric: He has a normal mood and affect.    Examination of the right shoulder demonstrates moderate swelling.  Radial pulses intact.  Deltoid does fire.  Shoulder motion otherwise is painful.  Skin is intact in the shoulder girdle region Assessment/Plan Impression is right shoulder fracture dislocation with displacement of the greater tuberosity fragment.  The overall alignment of the humerus in the head is is good.  Plan is open reduction internal fixation with plate fixation of the fracture.  Plan is also to incorporate that greater tuberosity fragment underneath the plate in order to place that back in better position.  The risk and benefits of surgery discussed include not limited to infection nerve vessel damage avascular necrosis developing later in the course of treatment which would require further surgery.  Patient understands risk and benefits.  All questions answered.  [name], MD 11/29/2018, 3:40 PM

## 2018-11-29 NOTE — Anesthesia Postprocedure Evaluation (Signed)
Anesthesia Post Note  Patient: Pierce Edgley  Procedure(s) Performed: RIGHT OPEN REDUCTION INTERNAL FIXATION (ORIF) SHOULDER FRACTURE (Right Shoulder)     Patient location during evaluation: PACU Anesthesia Type: Regional Level of consciousness: sedated Pain management: pain level controlled Vital Signs Assessment: post-procedure vital signs reviewed and stable Respiratory status: spontaneous breathing and respiratory function stable Cardiovascular status: stable Postop Assessment: no apparent nausea or vomiting Anesthetic complications: yes Anesthetic complication details: PONV   Last Vitals:  Vitals:   11/29/18 2100 11/29/18 2115  BP: 138/67 128/72  Pulse: 95 94  Resp: 18 11  Temp:    SpO2: 94% 97%    Last Pain:  Vitals:   11/29/18 2030  TempSrc:   PainSc: 0-No pain                 Anaria Kroner DANIEL

## 2018-11-29 NOTE — Progress Notes (Signed)
Pt's wife, Dr. Thurnell Garbe picked up the Pre-Surgery Ensure for pt

## 2018-11-29 NOTE — Brief Op Note (Signed)
   11/29/2018  7:40 PM  PATIENT:  [name]  58 y.o. male  PRE-OPERATIVE DIAGNOSIS:  right shoulder fracture  POST-OPERATIVE DIAGNOSIS:  right shoulder fracture  PROCEDURE:  Procedure(s): RIGHT OPEN REDUCTION INTERNAL FIXATION (ORIF) SHOULDER FRACTURE  SURGEON:  Surgeon(s): Marlou Sa, Tonna Corner, MD  ASSISTANT: magnant pa  ANESTHESIA:   general  EBL: 100 ml    No intake/output data recorded.  BLOOD ADMINISTERED: none  DRAINS: none   LOCAL MEDICATIONS USED:  vancomycin  SPECIMEN:  No Specimen  COUNTS:  YES  TOURNIQUET:  * No tourniquets in log *  DICTATION: .Other Dictation: Dictation Number [id number]  PLAN OF CARE: Discharge to home after PACU  PATIENT DISPOSITION:  PACU - hemodynamically stable

## 2018-11-29 NOTE — Anesthesia Procedure Notes (Signed)
Procedure Name: Intubation Date/Time: 11/29/2018 4:20 PM Performed by: Barrington Ellison, CRNA Pre-anesthesia Checklist: Patient identified, Emergency Drugs available, Suction available and Patient being monitored Patient Re-evaluated:Patient Re-evaluated prior to induction Oxygen Delivery Method: Circle System Utilized Preoxygenation: Pre-oxygenation with 100% oxygen Induction Type: IV induction Ventilation: Oral airway inserted - appropriate to patient size, Two handed mask ventilation required and Mask ventilation with difficulty Laryngoscope Size: Miller and 2 Grade View: Grade I Tube type: Oral Tube size: 7.5 mm Number of attempts: 3 Airway Equipment and Method: Oral airway and Bougie stylet Placement Confirmation: ETT inserted through vocal cords under direct vision,  positive ETCO2 and breath sounds checked- equal and bilateral Secured at: 22 cm Tube secured with: Tape Dental Injury: Teeth and Oropharynx as per pre-operative assessment  Future Recommendations: Recommend- induction with short-acting agent, and alternative techniques readily available Comments: Grade 1 view BUT unable to get enough angle on ETT to insert thru cords. Strong Sniffing position and Bougie Stylet utilized. Both CRNA and MDA had same Grade 1 view and difficulty with angle required for ETT

## 2018-11-29 NOTE — Anesthesia Preprocedure Evaluation (Signed)
Anesthesia Evaluation  Patient identified by MRN, date of birth, ID band Patient awake    Airway Mallampati: II  TM Distance: >3 FB Neck ROM: Full    Dental  (+) Teeth Intact, Dental Advisory Given   Pulmonary Current Smoker,    breath sounds clear to auscultation       Cardiovascular  Rhythm:Regular Rate:Normal     Neuro/Psych    GI/Hepatic   Endo/Other    Renal/GU      Musculoskeletal   Abdominal   Peds  Hematology   Anesthesia Other Findings   Reproductive/Obstetrics                             Anesthesia Physical Anesthesia Plan  ASA: II  Anesthesia Plan: General and Regional   Post-op Pain Management:    Induction: Intravenous  PONV Risk Score and Plan: Ondansetron and Dexamethasone  Airway Management Planned: Oral ETT  Additional Equipment:   Intra-op Plan:   Post-operative Plan: Extubation in OR  Informed Consent: I have reviewed the patients History and Physical, chart, labs and discussed the procedure including the risks, benefits and alternatives for the proposed anesthesia with the patient or authorized representative who has indicated his/her understanding and acceptance.     Dental advisory given  Plan Discussed with: CRNA and Anesthesiologist  Anesthesia Plan Comments:         Anesthesia Quick Evaluation

## 2018-11-29 NOTE — Progress Notes (Signed)
Spoke to Dr. Tobias Alexander about pts O2 sats hanging between 89-92 on RA.  Told MD singer that we were getting an incentive spirometer and he said pt needs to hang out and blow off some more gas and if we weren't able to get pts sats up he would need to stay.  Updated Dr. Elise Benne (pts wife).

## 2018-11-30 ENCOUNTER — Encounter (HOSPITAL_COMMUNITY): Payer: Self-pay | Admitting: Orthopedic Surgery

## 2018-11-30 NOTE — Op Note (Signed)
NAMEROSBEL, AMADOR MEDICAL RECORD [id number] ACCOUNT [id number] DATE OF BIRTH:1960-03-27 FACILITY: [facility] LOCATION: [facility] PHYSICIAN:Glennon Kopko Randel Pigg, MD  OPERATIVE REPORT  DATE OF PROCEDURE:  11/29/2018  PREOPERATIVE DIAGNOSIS:  Right proximal humerus fracture.  POSTOPERATIVE DIAGNOSIS:  Right proximal humerus fracture.  PROCEDURE:  Right proximal humerus fracture open reduction internal fixation with [employer] plate.  SURGEON:  [name], MD  ASSISTANT:  Annie Main, PA-C   INDICATIONS:  The patient is a 58 year old patient who is 2 days out from a proximal humerus fracture dislocation, presents now for operative management after explanation of risks and benefits.  PROCEDURE IN DETAIL:  The patient was brought to the operating room where general endotracheal anesthesia was induced.  Preoperative IV antibiotics were administered.  Timeout was called.  Right shoulder pre-scrubbed with hydrogen peroxide, alcohol, and  Betadine, which was allowed to air dry, prepped with ChloraPrep solution, and draped in a sterile manner.  Charlie Pitter was used to cover the operative field.  Timeout was called.  Incision made from coracoid process 2 cm lateral to the axillary crease.  Skin  and subcutaneous tissue were sharply divided.  Cephalic vein mobilized laterally.  Deltoid was elevated off of the proximal humerus.  The self-retaining retractor was placed.  The posterior infraspinatus fracture fragment was mobilized.  Sutures were  placed at the muscle tendon junction.  These were suture tapes for later mobilization.  There was also another fragment of the greater tuberosity through which suture tape was placed x3.  With the greater tuberosity comminuted fracture mobilized with  suture tape, they were reduced.  A plate was applied and checked under fluoroscopy with K wires.  The lesser tuberosity piece was nonfractured and stable.  The biceps tendon remained in the groove.  At this time,  the plate was applied.  It was affixed  with locking screws distally and nonlocking screws proximally.  Good reduction of the fragments was achieved.  The fracture itself was stable.  At this time, fluoroscopy was utilized to confirm correct screw placement.  Fracture was stable.  Thorough  irrigation was then performed.  Vancomycin powder placed.  The axillary nerve was palpated within the deltoid.  The deltopectoral interval was then closed using #1 Vicryl suture followed by interrupted inverted 0 Vicryl suture, 2-0 Vicryl suture, and a  3-0 Monocryl.  Steri-Strips and an Aquacel dressing were placed.  Shoulder sling applied.  Luke's assistance was required at all times for opening, closing, retraction, and fracture mobilization.  His assistance was a medical necessity.  LN/NUANCE  D:11/29/2018 T:11/30/2018 JOB:[id number]

## 2018-11-30 NOTE — Op Note (Signed)
NAMEJONNATHON, JAGIELSKI MEDICAL RECORD [id number] ACCOUNT [id number] DATE OF BIRTH:[birth year] FACILITY: [facility] LOCATION: [facility] PHYSICIAN:GREGORY Randel Pigg, MD  OPERATIVE REPORT  DATE OF PROCEDURE:  11/29/2018  PREOPERATIVE DIAGNOSIS:  Right proximal humerus fracture.  POSTOPERATIVE DIAGNOSIS:  Right proximal humerus fracture.  PROCEDURE:  Right proximal humerus fracture, open reduction internal fixation with [employer] plate.  SURGEON:  [name], MD  ASSISTANT:  Annie Main, PA-C   INDICATIONS:  The patient is a 58 year old patient with right shoulder pain.  He has right proximal humerus fracture, which was sustained 2 days ago.  He presents now for operative management after explanation of risks and benefits.  PROCEDURE IN DETAIL:  The patient was brought to the operating room where general endotracheal anesthesia was induced.  Preoperative antibiotics were administered.  Timeout was called.  The patient's right arm, shoulder, and hand pre-scrubbed with  hydrogen peroxide, alcohol, and Betadine, which was allowed to air dry, then prepped with ChloraPrep solution and draped in a sterile manner.  Charlie Pitter was used to cover the entire operative field.  An incision was made from the coracoid process extending  distally just about 2-3 cm lateral to the axillary crease.  Skin and subcutaneous tissue were sharply divided.  Cephalic vein mobilized laterally.  Deltopectoral interval was opened.  Deltoid was mobilized off of the proximal humerus.  LN/NUANCE  D:11/29/2018 T:11/30/2018 JOB:[id number]

## 2018-12-07 ENCOUNTER — Other Ambulatory Visit: Payer: Self-pay

## 2018-12-07 ENCOUNTER — Ambulatory Visit (INDEPENDENT_AMBULATORY_CARE_PROVIDER_SITE_OTHER): Payer: No Typology Code available for payment source | Admitting: Orthopedic Surgery

## 2018-12-07 ENCOUNTER — Encounter: Payer: Self-pay | Admitting: Orthopedic Surgery

## 2018-12-07 ENCOUNTER — Ambulatory Visit (INDEPENDENT_AMBULATORY_CARE_PROVIDER_SITE_OTHER): Payer: No Typology Code available for payment source

## 2018-12-07 DIAGNOSIS — S42291A Other displaced fracture of upper end of right humerus, initial encounter for closed fracture: Secondary | ICD-10-CM

## 2018-12-07 NOTE — Progress Notes (Signed)
   Post-Op Visit Note   Patient: [name]           Date of Birth: May 13, 1960           MRN: [id number] Visit Date: 12/07/2018 PCP: [name], MD   Assessment & Plan:  Chief Complaint:  Chief Complaint  Patient presents with  . Right Shoulder - Routine Post Op   Visit Diagnoses:  1. Other closed displaced fracture of proximal end of right humerus, initial encounter     Plan: Samnang is now about a week out right proximal humerus fracture fixation.  On exam deltoid fires.  Passive range of motion is reasonable.  Does have the expected amount of postop swelling in the arm.  Radiographs look good.  Plan at this time CP machine 1 hour twice a day with no active motion.  Okay also for elbow and wrist range of motion exercises but no lifting with that right arm.  I would prefer that he stay in the sling for the next 2 weeks when he is not in the CPM machine.  Come back in 2 weeks for repeat x-rays and likely discontinue sling at that time and proceed with more therapy and range of motion.  He is off pain medicine at this time.  Follow-Up Instructions: No follow-ups on file.   Orders:  Orders Placed This Encounter  Procedures  . XR Shoulder Right   No orders of the defined types were placed in this encounter.   Imaging: Xr Shoulder Right  Result Date: 12/07/2018 AP outlet axillary right shoulder reviewed.  Plate fixation of proximal humerus fracture in good position alignment with no complicating features.  Overall alignment improved compared to preoperative status.   PMFS History: Patient Active Problem List   Diagnosis Date Noted  . LTBI (latent tuberculosis infection) 04/21/2017   Past Medical History:  Diagnosis Date  . Arthritis    back  . History of kidney stones   . LTBI (latent tuberculosis infection)    treated (latent)    Family History  Problem Relation Age of Onset  . Other Mother        latent TB  . Rheum arthritis Mother   . Heart failure Father    . Renal Disease Father     Past Surgical History:  Procedure Laterality Date  . COLONOSCOPY    . HEMANGIOMA EXCISION     Dr. [name]  . LAMINECTOMY AND MICRODISCECTOMY LUMBAR SPINE  1999   L5-S1  . ORIF SHOULDER FRACTURE Right 11/29/2018   Procedure: RIGHT OPEN REDUCTION INTERNAL FIXATION (ORIF) SHOULDER FRACTURE;  Surgeon: [name], MD;  Location: [facility];  Service: Orthopedics;  Laterality: Right;   Social History   Occupational History  . Not on file  Tobacco Use  . Smoking status: Current Some Day Smoker    Types: Cigars  . Smokeless tobacco: Never Used  . Tobacco comment: occ  Substance and Sexual Activity  . Alcohol use: Yes    Alcohol/week: 2.0 standard drinks    Types: 2 Standard drinks or equivalent per week    Comment: occasional  . Drug use: No  . Sexual activity: Not on file

## 2018-12-16 ENCOUNTER — Telehealth: Payer: Self-pay | Admitting: Orthopedic Surgery

## 2018-12-16 NOTE — Telephone Encounter (Signed)
I would stay at 90 degrees and not go past that.  Thanks

## 2018-12-16 NOTE — Telephone Encounter (Signed)
Please advise. Thanks.  

## 2018-12-16 NOTE — Telephone Encounter (Signed)
Patient's wife called stating that the patient will reach 90 degrees today on his CPM machine.  She is wanting to know what they need to do until his appointment with Dr. Marlou Sa.  CB#[phone].  Thank you.

## 2018-12-16 NOTE — Telephone Encounter (Signed)
S/w patient and advised

## 2018-12-21 ENCOUNTER — Other Ambulatory Visit: Payer: Self-pay

## 2018-12-21 ENCOUNTER — Telehealth: Payer: Self-pay | Admitting: Orthopedic Surgery

## 2018-12-21 ENCOUNTER — Ambulatory Visit (INDEPENDENT_AMBULATORY_CARE_PROVIDER_SITE_OTHER): Payer: No Typology Code available for payment source

## 2018-12-21 ENCOUNTER — Encounter: Payer: Self-pay | Admitting: Orthopedic Surgery

## 2018-12-21 ENCOUNTER — Ambulatory Visit (INDEPENDENT_AMBULATORY_CARE_PROVIDER_SITE_OTHER): Payer: No Typology Code available for payment source | Admitting: Orthopedic Surgery

## 2018-12-21 DIAGNOSIS — S42291A Other displaced fracture of upper end of right humerus, initial encounter for closed fracture: Secondary | ICD-10-CM | POA: Diagnosis not present

## 2018-12-21 MED ORDER — HYDROCODONE-ACETAMINOPHEN 5-325 MG PO TABS: 1.0000 | ORAL_TABLET | Freq: Three times a day (TID) | ORAL | 0 refills | Status: DC | PRN

## 2018-12-21 NOTE — Progress Notes (Signed)
   Post-Op Visit Note   Patient: [name]           Date of Birth: Oct 20, 1960           MRN: [id number] Visit Date: 12/21/2018 PCP: [name], MD   Assessment & Plan:  Chief Complaint: No chief complaint on file.  Visit Diagnoses:  1. Other closed displaced fracture of proximal end of right humerus, initial encounter     Plan: It is now just from the time that that said something wrong [name] is a patient who is now 3 weeks out right shoulder proximal humerus fracture fixation.  On exam he has good passive range of motion.  Been doing the CPM machine at home.  Incision is intact.  Reporting little bit of numbness running down into the forearm but he has good motor sensory function to the hand.  Not much crepitus with passive range of motion.  X-rays look good with no change in fracture alignment.  Plan at this time is to discontinue the sling.  No lifting with the right arm.  Come back in 3 weeks for clinical recheck and initiation of strengthening.  Follow-Up Instructions: Return in about 3 weeks (around 01/11/2019).   Orders:  Orders Placed This Encounter  Procedures  . XR Shoulder Right   Meds ordered this encounter  Medications  . HYDROcodone-acetaminophen (NORCO/VICODIN) 5-325 MG tablet    Sig: Take 1 tablet by mouth every 8 (eight) hours as needed for moderate pain.    Dispense:  35 tablet    Refill:  0    Imaging: Xr Shoulder Right  Result Date: 12/21/2018 AP lateral merchant right shoulder reviewed.  Hardware in good position alignment.  No change in bone fragment position.  Minimal callus noted.  No evidence of lucency around the screws.   PMFS History: Patient Active Problem List   Diagnosis Date Noted  . LTBI (latent tuberculosis infection) 04/21/2017   Past Medical History:  Diagnosis Date  . Arthritis    back  . History of kidney stones   . LTBI (latent tuberculosis infection)    treated (latent)    Family History  Problem Relation Age of  Onset  . Other Mother        latent TB  . Rheum arthritis Mother   . Heart failure Father   . Renal Disease Father     Past Surgical History:  Procedure Laterality Date  . COLONOSCOPY    . HEMANGIOMA EXCISION     Dr. [name]  . LAMINECTOMY AND MICRODISCECTOMY LUMBAR SPINE  1999   L5-S1  . ORIF SHOULDER FRACTURE Right 11/29/2018   Procedure: RIGHT OPEN REDUCTION INTERNAL FIXATION (ORIF) SHOULDER FRACTURE;  Surgeon: [name], MD;  Location: [place];  Service: Orthopedics;  Laterality: Right;   Social History   Occupational History  . Not on file  Tobacco Use  . Smoking status: Current Some Day Smoker    Types: Cigars  . Smokeless tobacco: Never Used  . Tobacco comment: occ  Substance and Sexual Activity  . Alcohol use: Yes    Alcohol/week: 2.0 standard drinks    Types: 2 Standard drinks or equivalent per week    Comment: occasional  . Drug use: No  . Sexual activity: Not on file

## 2018-12-21 NOTE — Telephone Encounter (Signed)
Yes done by [facility]

## 2018-12-21 NOTE — Telephone Encounter (Signed)
Patient's wife left a voicemail message wanting to know if the medication will be sent into the pharmacy today.  CB#[phone].  Thank you.

## 2018-12-21 NOTE — Telephone Encounter (Signed)
IC LMVM advising done.  

## 2018-12-21 NOTE — Telephone Encounter (Signed)
Please advise. Thanks.  

## 2019-01-10 ENCOUNTER — Encounter: Payer: Self-pay | Admitting: Orthopedic Surgery

## 2019-01-16 ENCOUNTER — Ambulatory Visit (INDEPENDENT_AMBULATORY_CARE_PROVIDER_SITE_OTHER): Payer: No Typology Code available for payment source

## 2019-01-16 ENCOUNTER — Encounter: Payer: Self-pay | Admitting: Orthopedic Surgery

## 2019-01-16 ENCOUNTER — Other Ambulatory Visit: Payer: Self-pay

## 2019-01-16 ENCOUNTER — Ambulatory Visit (INDEPENDENT_AMBULATORY_CARE_PROVIDER_SITE_OTHER): Payer: No Typology Code available for payment source | Admitting: Orthopedic Surgery

## 2019-01-16 DIAGNOSIS — S42291A Other displaced fracture of upper end of right humerus, initial encounter for closed fracture: Secondary | ICD-10-CM | POA: Diagnosis not present

## 2019-01-16 NOTE — Progress Notes (Signed)
Post-Op Visit Note   Patient: [name]           Date of Birth: May 06, 1960           MRN: [id number] Visit Date: 01/16/2019 PCP: Daniel Redwood, MD   Assessment & Plan:  Chief Complaint:  Chief Complaint  Patient presents with  . Right Shoulder - Follow-up   Visit Diagnoses:  1. Other closed displaced fracture of proximal end of right humerus, initial encounter     Plan: [name] is a 58 year old patient who is now about 7 weeks out right proximal humerus fracture fixation.  He has been doing well.  He has been in the CPM machine.  On exam his range of motion is pretty seamless.  He is got reasonable rotator cuff strength.  About 20 degrees of external rotation of 15 degrees of abduction.  Passively he is got about 85 of isolated glenohumeral abduction and about 95 isolated forward flexion.  At this time I would like to have him continue to work on passive range of motion and active assisted range of motion to achieve a little bit more range of motion.  We will start doing some strengthening exercises in January.  Continue with the CPM machine for about 3 more weeks for 2 to 3 hours a day.  Follow-up with me with repeat radiographs in 5 weeks.  He is off pain medicine.  Follow-Up Instructions: Return in about 5 weeks (around 02/20/2019).   Orders:  Orders Placed This Encounter  Procedures  . XR Shoulder Right  . Ambulatory referral to Physical Therapy   No orders of the defined types were placed in this encounter.   Imaging: Xr Shoulder Right  Result Date: 01/16/2019 AP outlet axillary right shoulder reviewed.  Hardware remains in good position from proximal humerus fracture fixation.  No lucencies around the pegs.  Shoulder is located.  Some callus formation noted at the inferior metaphyseal region near the neck.  No migration of the greater tuberosity fragment above the level of the humeral head superiorly   PMFS History: Patient Active Problem List   Diagnosis Date Noted   . LTBI (latent tuberculosis infection) 04/21/2017   Past Medical History:  Diagnosis Date  . Arthritis    back  . History of kidney stones   . LTBI (latent tuberculosis infection)    treated (latent)    Family History  Problem Relation Age of Onset  . Other Mother        latent TB  . Rheum arthritis Mother   . Heart failure Father   . Renal Disease Father     Past Surgical History:  Procedure Laterality Date  . COLONOSCOPY    . HEMANGIOMA EXCISION     Dr. Amedeo Anderson  . LAMINECTOMY AND MICRODISCECTOMY LUMBAR SPINE  1999   L5-S1  . ORIF SHOULDER FRACTURE Right 11/29/2018   Procedure: RIGHT OPEN REDUCTION INTERNAL FIXATION (ORIF) SHOULDER FRACTURE;  Surgeon: Daniel Pel, MD;  Location: [facility];  Service: Orthopedics;  Laterality: Right;   Social History   Occupational History  . Not on file  Tobacco Use  . Smoking status: Current Some Day Smoker    Types: Cigars  . Smokeless tobacco: Never Used  . Tobacco comment: occ  Substance and Sexual Activity  . Alcohol use: Yes    Alcohol/week: 2.0 standard drinks    Types: 2 Standard drinks or equivalent per week    Comment: occasional  . Drug use: No  . Sexual  activity: Not on file

## 2019-01-31 ENCOUNTER — Other Ambulatory Visit: Payer: Self-pay

## 2019-01-31 ENCOUNTER — Ambulatory Visit (INDEPENDENT_AMBULATORY_CARE_PROVIDER_SITE_OTHER): Payer: No Typology Code available for payment source | Admitting: Physical Therapy

## 2019-01-31 ENCOUNTER — Encounter: Payer: Self-pay | Admitting: Physical Therapy

## 2019-01-31 DIAGNOSIS — M6281 Muscle weakness (generalized): Secondary | ICD-10-CM

## 2019-01-31 DIAGNOSIS — M25511 Pain in right shoulder: Secondary | ICD-10-CM | POA: Diagnosis not present

## 2019-01-31 DIAGNOSIS — R293 Abnormal posture: Secondary | ICD-10-CM

## 2019-01-31 DIAGNOSIS — M25611 Stiffness of right shoulder, not elsewhere classified: Secondary | ICD-10-CM | POA: Diagnosis not present

## 2019-01-31 NOTE — Therapy (Signed)
[facility] Physical Therapy [address] [place], [place], [address] Phone: [phone]   Fax:  [phone]  Physical Therapy Evaluation  Patient Details  Name: [name] MRN: [id number] Date of Birth: 04-Jul-1960 Referring Provider (PT): Donella Stade, [place]   Encounter Date: 01/31/2019  PT End of Session - 01/31/19 0934    Visit Number  1    Number of Visits  16    Date for PT Re-Evaluation  03/28/19    PT Start Time  0850    PT Stop Time  0925    PT Time Calculation (min)  35 min    Activity Tolerance  Patient tolerated treatment well    Behavior During Therapy  [facility] for tasks assessed/performed       Past Medical History:  Diagnosis Date  . Arthritis    back  . History of kidney stones   . LTBI (latent tuberculosis infection)    treated (latent)    Past Surgical History:  Procedure Laterality Date  . COLONOSCOPY    . HEMANGIOMA EXCISION     Dr. [name]  . LAMINECTOMY AND MICRODISCECTOMY LUMBAR SPINE  1999   L5-S1  . ORIF SHOULDER FRACTURE Right 11/29/2018   Procedure: RIGHT OPEN REDUCTION INTERNAL FIXATION (ORIF) SHOULDER FRACTURE;  Surgeon: [name], MD;  Location: [place];  Service: Orthopedics;  Laterality: Right;    There were no vitals filed for this visit.   Subjective Assessment - 01/31/19 0852    Subjective  Pt is a 58 y/o male who presents to OPPT s/p Rt shoulder dislocation and humerus fx resulting in reduction and humerus ORIF on 11/29/2018.  Pt reports decreased motion and strength.    Limitations  Lifting    Patient Stated Goals  regain motion and use of Rt shoulder; be able to cast fly fishing rod; get into/out of scuba gear    Currently in Pain?  Yes    Pain Score  2    at best 1-2/10; up to 5/10   Pain Location  Shoulder    Pain Orientation  Right    Pain Descriptors / Indicators  Aching;Dull;Sharp    Pain Type  Acute pain;Surgical pain    Pain Onset  More than a month ago    Pain Frequency   Intermittent    Aggravating Factors   end ranges, lying on Rt side    Pain Relieving Factors  avoidance of provoking factors, medication         [facility] PT Assessment - 01/31/19 0857      Assessment   Medical Diagnosis  Rt humerus ORIF     Referring Provider (PT)  Magnant, Gerrianne Scale, PA-C    Onset Date/Surgical Date  11/29/18    Hand Dominance  Left    Next MD Visit  02/21/2018    Prior Therapy  unrelated to shoulder      Precautions   Precaution Comments  no strengthening until Jan      Balance Screen   Has the patient fallen in the past 6 months  No    Has the patient had a decrease in activity level because of a fear of falling?   No    Is the patient reluctant to leave their home because of a fear of falling?   No      Home Film/video editor residence    Living Arrangements  Spouse/significant other    Type of [place]  Home Access  Stairs to enter    Additional Comments  I with ADLs      Prior Function   Level of Independence  Independent    Vocation  Retired    [employer]  retired [employer]    Leisure  shooting, scuba dive, fly fishing; daily walking for exercise      Cognition   Overall Cognitive Status  Within Functional Limits for tasks assessed      Posture/Postural Control   Posture/Postural Control  Postural limitations    Postural Limitations  Rounded Shoulders;Forward head      ROM / Strength   AROM / PROM / Strength  AROM;PROM;Strength      AROM   AROM Assessment Site  Shoulder    Right/Left Shoulder  Left    Left Shoulder Flexion  161 Degrees    Left Shoulder ABduction  180 Degrees    Left Shoulder Internal Rotation  85 Degrees    Left Shoulder External Rotation  72 Degrees      PROM   PROM Assessment Site  Shoulder    Right/Left Shoulder  Right    Right Shoulder Flexion  136 Degrees    Right Shoulder ABduction  102 Degrees    Right Shoulder Internal Rotation  57 Degrees    Right Shoulder External  Rotation  35 Degrees      Strength   Overall Strength Comments  deferred due to PT orders                Objective measurements completed on examination: See above findings.      [facility] Adult PT Treatment/Exercise - 01/31/19 0857      Exercises   Exercises  Shoulder      Shoulder Exercises: Supine   External Rotation  Left;AAROM;10 reps   cane   Flexion  AAROM;10 reps   cane     Shoulder Exercises: Standing   Internal Rotation  AAROM;10 reps   cane   ABduction  Left;AAROM;5 reps   cane, scaption            PT Education - 01/31/19 0934    Education Details  HEP    Person(s) Educated  Patient    Methods  Explanation;Demonstration;Handout    Comprehension  Verbalized understanding;Returned demonstration;Need further instruction       PT Short Term Goals - 01/31/19 0943      PT SHORT TERM GOAL #1   Title  improve Rt shoulder PROM by at least 15 deg all motions for improved function    Status  New    Target Date  02/28/19        PT Long Term Goals - 01/31/19 0944      PT LONG TERM GOAL #1   Title  independent with advanced HEP    Status  New    Target Date  03/28/19      PT LONG TERM GOAL #2   Title  improve Rt shoulder AROM to within 80% of Lt shoulder for improved function and mobility    Status  New    Target Date  03/28/19      PT LONG TERM GOAL #3   Title  demonstrate 4/5 Rt shoulder strength for improved function    Status  New    Target Date  03/28/19      PT LONG TERM GOAL #4   Title  report pain < 3/10 with activity for improved function    Status  New    Target Date  03/28/19             Plan - 01/31/19 0919    Clinical Impression Statement  Pt is a 58 y/o male who presents to OPPT s/p Rt humerus ORIF on 11/29/18.  Pt demonstrates continued mild post-op pain, decreased ROM and strength with postural abnormalities affecting functional mobility.  Pt will benefit from PT to address deficits.    Personal Factors and  Comorbidities  Comorbidity 1    Comorbidities  OA    Examination-Activity Limitations  Lift;Reach Overhead;Carry    Examination-Participation Restrictions  Yard Work;Other;Community Activity   fitness   Stability/Clinical Decision Making  Stable/Uncomplicated    Clinical Decision Making  Low    Rehab Potential  Good    PT Frequency  2x / week    PT Duration  8 weeks    PT Treatment/Interventions  ADLs/Self Care Home Management;Cryotherapy;Electrical Stimulation;Ultrasound;Moist Heat;Functional mobility training;Therapeutic activities;Therapeutic exercise;Patient/family education;Neuromuscular re-education;Manual techniques;Scar mobilization;Vasopneumatic Device;Taping;Passive range of motion;Dry needling    PT Next Visit Plan  review HEP, progress PROM PRN, begin strengthening in Jan    PT Home Exercise Plan  Access Code: [email or web address]    Consulted and Agree with Plan of Care  Patient       Patient will benefit from skilled therapeutic intervention in order to improve the following deficits and impairments:  Pain, Postural dysfunction, Impaired UE functional use, Decreased strength, Decreased range of motion  Visit Diagnosis: Acute pain of right shoulder - Plan: PT plan of care cert/re-cert  Stiffness of right shoulder, not elsewhere classified - Plan: PT plan of care cert/re-cert  Abnormal posture - Plan: PT plan of care cert/re-cert  Muscle weakness (generalized) - Plan: PT plan of care cert/re-cert     Problem List Patient Active Problem List   Diagnosis Date Noted  . LTBI (latent tuberculosis infection) 04/21/2017        Laureen Abrahams, PT, DPT 01/31/19 9:47 AM     [facility] Physical Therapy [address] [place], [place], [address] Phone: [phone]   Fax:  [phone]  Name: [name] MRN: [id number] Date of Birth: [birth year]

## 2019-01-31 NOTE — Patient Instructions (Signed)
Access Code: [email or web address]  URL: [email or web address]  Date: 01/31/2019  Prepared by: Faustino Congress   Exercises Supine Shoulder Flexion with Dowel - 10 reps - 1 sets - 3-5 sec hold - 2x daily - 7x weekly Supine Shoulder External Rotation with Dowel - 10 reps - 1 sets - 1-2 sec hold - 2x daily - 7x weekly Standing Bilateral Shoulder Internal Rotation AAROM with Dowel - 10 reps - 1 sets - 2x daily - 7x weekly Standing Shoulder Internal Rotation AAROM with Dowel - 10 reps                   - 1 sets - 2x daily - 7x weekly Shoulder Scaption AAROM with Dowel - 15 reps - 1 sets - 2x daily                            - 7x weekly

## 2019-02-08 ENCOUNTER — Other Ambulatory Visit: Payer: Self-pay

## 2019-02-08 ENCOUNTER — Encounter: Payer: Self-pay | Admitting: Physical Therapy

## 2019-02-08 ENCOUNTER — Ambulatory Visit (INDEPENDENT_AMBULATORY_CARE_PROVIDER_SITE_OTHER): Payer: No Typology Code available for payment source | Admitting: Physical Therapy

## 2019-02-08 DIAGNOSIS — M25511 Pain in right shoulder: Secondary | ICD-10-CM

## 2019-02-08 DIAGNOSIS — M25611 Stiffness of right shoulder, not elsewhere classified: Secondary | ICD-10-CM

## 2019-02-08 DIAGNOSIS — M6281 Muscle weakness (generalized): Secondary | ICD-10-CM | POA: Diagnosis not present

## 2019-02-08 DIAGNOSIS — R293 Abnormal posture: Secondary | ICD-10-CM | POA: Diagnosis not present

## 2019-02-08 NOTE — Therapy (Signed)
[facility] Physical Therapy [address] [place], [place], [address] Phone: [phone]   Fax:  [phone]  Physical Therapy Treatment  Patient Details  Name: [name] MRN: [id number] Date of Birth: 1960-02-25 Referring Provider (PT): Donella Stade, [place]   Encounter Date: 02/08/2019  PT End of Session - 02/08/19 0844    Visit Number  2    Number of Visits  16    Date for PT Re-Evaluation  03/28/19    PT Start Time  0805    PT Stop Time  [id number]    PT Time Calculation (min)  39 min    Activity Tolerance  Patient tolerated treatment well    Behavior During Therapy  [facility] for tasks assessed/performed       Past Medical History:  Diagnosis Date  . Arthritis    back  . History of kidney stones   . LTBI (latent tuberculosis infection)    treated (latent)    Past Surgical History:  Procedure Laterality Date  . COLONOSCOPY    . HEMANGIOMA EXCISION     Dr. [name]  . LAMINECTOMY AND MICRODISCECTOMY LUMBAR SPINE  1999   L5-S1  . ORIF SHOULDER FRACTURE Right 11/29/2018   Procedure: RIGHT OPEN REDUCTION INTERNAL FIXATION (ORIF) SHOULDER FRACTURE;  Surgeon: [name], MD;  Location: [place];  Service: Orthopedics;  Laterality: Right;    There were no vitals filed for this visit.  Subjective Assessment - 02/08/19 0807    Subjective  overall shoulder is doing well; feels like motion is improving.  aches at night.    Limitations  Lifting    Patient Stated Goals  regain motion and use of Rt shoulder; be able to cast fly fishing rod; get into/out of scuba gear    Currently in Pain?  No/denies    Pain Onset  More than a month ago         [facility] PT Assessment - 02/08/19 0820      Assessment   Medical Diagnosis  Rt humerus ORIF     Referring Provider (PT)  Magnant, Gerrianne Scale, PA-C    Onset Date/Surgical Date  11/29/18    Hand Dominance  Left    Next MD Visit  02/21/2018      PROM   Right Shoulder External Rotation  44 Degrees                    [facility] Adult PT Treatment/Exercise - 02/08/19 0808      Shoulder Exercises: Supine   External Rotation  Left;AAROM;20 reps   cane   Flexion  AAROM;20 reps   cane; 10 sec hold     Shoulder Exercises: Standing   Internal Rotation  AAROM;20 reps   cane   ABduction  Left;AAROM;20 reps   cane, scaption     Shoulder Exercises: Pulleys   Flexion  2 minutes    Scaption  2 minutes      Manual Therapy   Manual Therapy  Passive ROM    Passive ROM  Rt shoulder flexion/abduction/er/ir to tolerance               PT Short Term Goals - 01/31/19 0943      PT SHORT TERM GOAL #1   Title  improve Rt shoulder PROM by at least 15 deg all motions for improved function    Status  New    Target Date  02/28/19        PT Long Term Goals -  01/31/19 0944      PT LONG TERM GOAL #1   Title  independent with advanced HEP    Status  New    Target Date  03/28/19      PT LONG TERM GOAL #2   Title  improve Rt shoulder AROM to within 80% of Lt shoulder for improved function and mobility    Status  New    Target Date  03/28/19      PT LONG TERM GOAL #3   Title  demonstrate 4/5 Rt shoulder strength for improved function    Status  New    Target Date  03/28/19      PT LONG TERM GOAL #4   Title  report pain < 3/10 with activity for improved function    Status  New    Target Date  03/28/19            Plan - 02/08/19 0845    Clinical Impression Statement  Pt demonstrated independence with HEP and overall progressing well within current POC and MD orders.  Will continue to benefit from PT to maximize function.    Personal Factors and Comorbidities  Comorbidity 1    Comorbidities  OA    Examination-Activity Limitations  Lift;Reach Overhead;Carry    Examination-Participation Restrictions  Yard Work;Other;Community Activity   fitness   Stability/Clinical Decision Making  Stable/Uncomplicated    Rehab Potential  Good    PT Frequency  2x / week    PT  Duration  8 weeks    PT Treatment/Interventions  ADLs/Self Care Home Management;Cryotherapy;Electrical Stimulation;Ultrasound;Moist Heat;Functional mobility training;Therapeutic activities;Therapeutic exercise;Patient/family education;Neuromuscular re-education;Manual techniques;Scar mobilization;Vasopneumatic Device;Taping;Passive range of motion;Dry needling    PT Next Visit Plan  review HEP, progress PROM PRN, begin strengthening in Jan; measure next visit    PT Home Exercise Plan  Access Code: [email or web address]    Consulted and Agree with Plan of Care  Patient       Patient will benefit from skilled therapeutic intervention in order to improve the following deficits and impairments:  Pain, Postural dysfunction, Impaired UE functional use, Decreased strength, Decreased range of motion  Visit Diagnosis: Acute pain of right shoulder  Abnormal posture  Stiffness of right shoulder, not elsewhere classified  Muscle weakness (generalized)     Problem List Patient Active Problem List   Diagnosis Date Noted  . LTBI (latent tuberculosis infection) 04/21/2017        Laureen Abrahams, PT, DPT 02/08/19 8:47 AM     [facility] Physical Therapy [address] [place], [place], [address] Phone: [phone]   Fax:  [phone]  Name: [name] MRN: [id number] Date of Birth: 1960/08/17

## 2019-02-15 ENCOUNTER — Ambulatory Visit: Payer: No Typology Code available for payment source | Admitting: Physical Therapy

## 2019-02-15 ENCOUNTER — Other Ambulatory Visit: Payer: Self-pay

## 2019-02-15 DIAGNOSIS — M6281 Muscle weakness (generalized): Secondary | ICD-10-CM

## 2019-02-15 DIAGNOSIS — M25511 Pain in right shoulder: Secondary | ICD-10-CM

## 2019-02-15 DIAGNOSIS — R293 Abnormal posture: Secondary | ICD-10-CM | POA: Diagnosis not present

## 2019-02-15 DIAGNOSIS — M25611 Stiffness of right shoulder, not elsewhere classified: Secondary | ICD-10-CM | POA: Diagnosis not present

## 2019-02-15 NOTE — Therapy (Signed)
[facility] Physical Therapy [address] [place], [place], [address] Phone: [phone]   Fax:  [phone]  Physical Therapy Treatment  Patient Details  Name: [name] MRN: [id number] Date of Birth: 10-02-1960 Referring Provider (PT): Donella Stade, [place]   Encounter Date: 02/15/2019  PT End of Session - 02/15/19 1019    Visit Number  3    Number of Visits  16    Date for PT Re-Evaluation  03/28/19    PT Start Time  0930    PT Stop Time  1010    PT Time Calculation (min)  40 min    Activity Tolerance  Patient tolerated treatment well    Behavior During Therapy  [facility] for tasks assessed/performed       Past Medical History:  Diagnosis Date  . Arthritis    back  . History of kidney stones   . LTBI (latent tuberculosis infection)    treated (latent)    Past Surgical History:  Procedure Laterality Date  . COLONOSCOPY    . HEMANGIOMA EXCISION     Dr. [name]  . LAMINECTOMY AND MICRODISCECTOMY LUMBAR SPINE  1999   L5-S1  . ORIF SHOULDER FRACTURE Right 11/29/2018   Procedure: RIGHT OPEN REDUCTION INTERNAL FIXATION (ORIF) SHOULDER FRACTURE;  Surgeon: [name], MD;  Location: [place];  Service: Orthopedics;  Laterality: Right;    There were no vitals filed for this visit.  Subjective Assessment - 02/15/19 0947    Subjective  shoulder is coming along okay. More stiffness than pain in the shoulder    Limitations  Lifting    Patient Stated Goals  regain motion and use of Rt shoulder; be able to cast fly fishing rod; get into/out of scuba gear    Pain Onset  More than a month ago         [facility] Adult PT Treatment/Exercise - 02/15/19 0001      Shoulder Exercises: Supine   External Rotation  Right;20 reps;AAROM    Flexion  AAROM;20 reps      Shoulder Exercises: Standing   Internal Rotation  AAROM;20 reps    ABduction  Right;20 reps;AAROM    Extension  Both;20 reps;AAROM      Shoulder Exercises: Pulleys   Flexion  2 minutes    Scaption  2 minutes      Shoulder Exercises: ROM/Strengthening   UBE (Upper Arm Bike)  no resistance 2 min fwd/2 min retro with Lt arm doing the driving    Ranger  15 reps for AROM into Rt shoulder flexion, scaption, circles      Shoulder Exercises: Stretch   Table Stretch - External Rotation  10 seconds;5 reps    Other Shoulder Stretches  doorway stretch unilat of Rt side 30 sec X 3 gentle      Manual Therapy   Passive ROM  Rt shoulder flexion/abduction/er/ir to tolerance               PT Short Term Goals - 01/31/19 0943      PT SHORT TERM GOAL #1   Title  improve Rt shoulder PROM by at least 15 deg all motions for improved function    Status  New    Target Date  02/28/19        PT Long Term Goals - 01/31/19 0944      PT LONG TERM GOAL #1   Title  independent with advanced HEP    Status  New    Target Date  03/28/19      PT LONG TERM GOAL #2   Title  improve Rt shoulder AROM to within 80% of Lt shoulder for improved function and mobility    Status  New    Target Date  03/28/19      PT LONG TERM GOAL #3   Title  demonstrate 4/5 Rt shoulder strength for improved function    Status  New    Target Date  03/28/19      PT LONG TERM GOAL #4   Title  report pain < 3/10 with activity for improved function    Status  New    Target Date  03/28/19            Plan - 02/15/19 1021    Clinical Impression Statement  Pt is progressing well with ROM but continues to lack ER ROM. Gave him more stretching for ER to perform at home. PT will begin strength next vsit per MD POC recommendations.    Personal Factors and Comorbidities  Comorbidity 1    Comorbidities  OA    Examination-Activity Limitations  Lift;Reach Overhead;Carry    Examination-Participation Restrictions  Yard Work;Other;Community Activity   fitness   Stability/Clinical Decision Making  Stable/Uncomplicated    Rehab Potential  Good    PT Frequency  2x / week    PT Duration  8 weeks    PT  Treatment/Interventions  ADLs/Self Care Home Management;Cryotherapy;Electrical Stimulation;Ultrasound;Moist Heat;Functional mobility training;Therapeutic activities;Therapeutic exercise;Patient/family education;Neuromuscular re-education;Manual techniques;Scar mobilization;Vasopneumatic Device;Taping;Passive range of motion;Dry needling    PT Next Visit Plan  begin strengthening in Jan; measure next visit    PT Home Exercise Plan  Access Code: [email or web address]    Consulted and Agree with Plan of Care  Patient       Patient will benefit from skilled therapeutic intervention in order to improve the following deficits and impairments:  Pain, Postural dysfunction, Impaired UE functional use, Decreased strength, Decreased range of motion  Visit Diagnosis: Acute pain of right shoulder  Abnormal posture  Stiffness of right shoulder, not elsewhere classified  Muscle weakness (generalized)     Problem List Patient Active Problem List   Diagnosis Date Noted  . LTBI (latent tuberculosis infection) 04/21/2017    Silvestre Mesi 02/15/2019, 10:30 AM  [facility] Physical Therapy [address] [place], [place], [address] Phone: [phone]   Fax:  [phone]  Name: [name] MRN: [id number] Date of Birth: [birth year]

## 2019-02-20 ENCOUNTER — Encounter: Payer: Self-pay | Admitting: Physical Therapy

## 2019-02-20 ENCOUNTER — Ambulatory Visit: Payer: No Typology Code available for payment source | Admitting: Physical Therapy

## 2019-02-20 ENCOUNTER — Other Ambulatory Visit: Payer: Self-pay

## 2019-02-20 DIAGNOSIS — R293 Abnormal posture: Secondary | ICD-10-CM | POA: Diagnosis not present

## 2019-02-20 DIAGNOSIS — M25611 Stiffness of right shoulder, not elsewhere classified: Secondary | ICD-10-CM | POA: Diagnosis not present

## 2019-02-20 DIAGNOSIS — M6281 Muscle weakness (generalized): Secondary | ICD-10-CM

## 2019-02-20 DIAGNOSIS — M25511 Pain in right shoulder: Secondary | ICD-10-CM

## 2019-02-20 NOTE — Therapy (Signed)
[facility] Physical Therapy [address] [place], [place], [address] Phone: [phone]   Fax:  [phone]  Physical Therapy Treatment  Patient Details  Name: [name] MRN: [id number] Date of Birth: Nov 30, 1960 Referring Provider (PT): Donella Stade, [place]   Encounter Date: 02/20/2019  PT End of Session - 02/20/19 1616    Visit Number  4    Number of Visits  16    Date for PT Re-Evaluation  03/28/19    PT Start Time  [id number]    PT Stop Time  1602    PT Time Calculation (min)  39 min    Activity Tolerance  Patient tolerated treatment well    Behavior During Therapy  [facility] for tasks assessed/performed       Past Medical History:  Diagnosis Date  . Arthritis    back  . History of kidney stones   . LTBI (latent tuberculosis infection)    treated (latent)    Past Surgical History:  Procedure Laterality Date  . COLONOSCOPY    . HEMANGIOMA EXCISION     Dr. [name]  . LAMINECTOMY AND MICRODISCECTOMY LUMBAR SPINE  1999   L5-S1  . ORIF SHOULDER FRACTURE Right 11/29/2018   Procedure: RIGHT OPEN REDUCTION INTERNAL FIXATION (ORIF) SHOULDER FRACTURE;  Surgeon: [name], MD;  Location: [place];  Service: Orthopedics;  Laterality: Right;    There were no vitals filed for this visit.  Subjective Assessment - 02/20/19 1522    Subjective  having some soreness, but overall shoulder is doing well.    Limitations  Lifting    Patient Stated Goals  regain motion and use of Rt shoulder; be able to cast fly fishing rod; get into/out of scuba gear    Currently in Pain?  No/denies    Pain Onset  More than a month ago                       [facility] Adult PT Treatment/Exercise - 02/20/19 1524      Shoulder Exercises: Supine   Protraction  Right;15 reps;Weights    Protraction Weight (lbs)  3# bar      Shoulder Exercises: Prone   Retraction  Right;15 reps;Weights    Retraction Weight (lbs)  5# KB    Extension  Right;15 reps;Weights    Extension Weight (lbs)  5# KB      Shoulder Exercises: Standing   Internal Rotation  Right;15 reps;Theraband    Theraband Level (Shoulder Internal Rotation)  Level 1 (Yellow)    Flexion  Right;15 reps;Weights    Shoulder Flexion Weight (lbs)  3# bar    ABduction  Right;15 reps;Weights    Shoulder ABduction Weight (lbs)  3# bar    Row  Both;15 reps;Theraband    Theraband Level (Shoulder Row)  Level 1 (Yellow)    Row Limitations  5 sec hold    Other Standing Exercises  wall ladder flexion x 15 reps      Shoulder Exercises: ROM/Strengthening   Ranger  15 reps for AROM into Rt shoulder flexion, scaption, circles    Rhythmic Stabilization, Supine  2# med ball: circles x 15; A-Z      Manual Therapy   Manual Therapy  Joint mobilization;Passive ROM    Joint Mobilization  gentle grades 1-2 A/P, P/A and inf mobs    Passive ROM  Rt shoulder flexion/abduction/er/ir to tolerance               PT Short  Term Goals - 01/31/19 0943      PT SHORT TERM GOAL #1   Title  improve Rt shoulder PROM by at least 15 deg all motions for improved function    Status  New    Target Date  02/28/19        PT Long Term Goals - 01/31/19 0944      PT LONG TERM GOAL #1   Title  independent with advanced HEP    Status  New    Target Date  03/28/19      PT LONG TERM GOAL #2   Title  improve Rt shoulder AROM to within 80% of Lt shoulder for improved function and mobility    Status  New    Target Date  03/28/19      PT LONG TERM GOAL #3   Title  demonstrate 4/5 Rt shoulder strength for improved function    Status  New    Target Date  03/28/19      PT LONG TERM GOAL #4   Title  report pain < 3/10 with activity for improved function    Status  New    Target Date  03/28/19            Plan - 02/20/19 1616    Clinical Impression Statement  Pt tolerated gentle strengthening exercises well today and anticipate will be able to progress HEP next session.  Will continue to benefit from PT to  maximize function.    Personal Factors and Comorbidities  Comorbidity 1    Comorbidities  OA    Examination-Activity Limitations  Lift;Reach Overhead;Carry    Examination-Participation Restrictions  Yard Work;Other;Community Activity   fitness   Stability/Clinical Decision Making  Stable/Uncomplicated    Rehab Potential  Good    PT Frequency  2x / week    PT Duration  8 weeks    PT Treatment/Interventions  ADLs/Self Care Home Management;Cryotherapy;Electrical Stimulation;Ultrasound;Moist Heat;Functional mobility training;Therapeutic activities;Therapeutic exercise;Patient/family education;Neuromuscular re-education;Manual techniques;Scar mobilization;Vasopneumatic Device;Taping;Passive range of motion;Dry needling    PT Next Visit Plan  measure, MD note for appt 1/6 PM, update HEP to add strengthening    PT Home Exercise Plan  Access Code: [email or web address]    Consulted and Agree with Plan of Care  Patient       Patient will benefit from skilled therapeutic intervention in order to improve the following deficits and impairments:  Pain, Postural dysfunction, Impaired UE functional use, Decreased strength, Decreased range of motion  Visit Diagnosis: Acute pain of right shoulder  Abnormal posture  Stiffness of right shoulder, not elsewhere classified  Muscle weakness (generalized)     Problem List Patient Active Problem List   Diagnosis Date Noted  . LTBI (latent tuberculosis infection) 04/21/2017      Laureen Abrahams, PT, DPT 02/20/19 4:18 PM     [facility] [address] [place], [place], [address] Phone: [phone]   Fax:  [phone]  Name: [name] MRN: [id number] Date of Birth: [birth year]

## 2019-02-22 ENCOUNTER — Encounter: Payer: Self-pay | Admitting: Physical Therapy

## 2019-02-22 ENCOUNTER — Ambulatory Visit: Payer: No Typology Code available for payment source | Admitting: Orthopedic Surgery

## 2019-02-22 ENCOUNTER — Other Ambulatory Visit: Payer: Self-pay

## 2019-02-22 ENCOUNTER — Encounter: Payer: Self-pay | Admitting: Orthopedic Surgery

## 2019-02-22 ENCOUNTER — Ambulatory Visit: Payer: No Typology Code available for payment source | Admitting: Physical Therapy

## 2019-02-22 ENCOUNTER — Ambulatory Visit (INDEPENDENT_AMBULATORY_CARE_PROVIDER_SITE_OTHER): Payer: No Typology Code available for payment source

## 2019-02-22 DIAGNOSIS — M25511 Pain in right shoulder: Secondary | ICD-10-CM

## 2019-02-22 DIAGNOSIS — R293 Abnormal posture: Secondary | ICD-10-CM

## 2019-02-22 DIAGNOSIS — S42291A Other displaced fracture of upper end of right humerus, initial encounter for closed fracture: Secondary | ICD-10-CM

## 2019-02-22 DIAGNOSIS — M6281 Muscle weakness (generalized): Secondary | ICD-10-CM

## 2019-02-22 DIAGNOSIS — M25611 Stiffness of right shoulder, not elsewhere classified: Secondary | ICD-10-CM

## 2019-02-22 NOTE — Therapy (Signed)
[facility] Physical Therapy [address] [place], [place], [address] Phone: [phone]   Fax:  [phone]  Physical Therapy Treatment  Patient Details  Name: [name] MRN: [id number] Date of Birth: 14-Sep-1960 Referring Provider (PT): Donella Stade, [place]   Encounter Date: 02/22/2019  PT End of Session - 02/22/19 1240    Visit Number  5    Number of Visits  16    Date for PT Re-Evaluation  03/28/19    PT Start Time  [id number]    PT Stop Time  1223    PT Time Calculation (min)  42 min    Activity Tolerance  Patient tolerated treatment well    Behavior During Therapy  [facility] for tasks assessed/performed       Past Medical History:  Diagnosis Date  . Arthritis    back  . History of kidney stones   . LTBI (latent tuberculosis infection)    treated (latent)    Past Surgical History:  Procedure Laterality Date  . COLONOSCOPY    . HEMANGIOMA EXCISION     Dr. [name]  . LAMINECTOMY AND MICRODISCECTOMY LUMBAR SPINE  1999   L5-S1  . ORIF SHOULDER FRACTURE Right 11/29/2018   Procedure: RIGHT OPEN REDUCTION INTERNAL FIXATION (ORIF) SHOULDER FRACTURE;  Surgeon: [name], MD;  Location: [place];  Service: Orthopedics;  Laterality: Right;    There were no vitals filed for this visit.  Subjective Assessment - 02/22/19 1145    Subjective  doing well; wants to be able to put dive gear on and cast fly fishing rod    Limitations  Lifting    Patient Stated Goals  regain motion and use of Rt shoulder; be able to cast fly fishing rod; get into/out of scuba gear    Currently in Pain?  No/denies    Pain Onset  More than a month ago         [facility] PT Assessment - 02/22/19 1149      AROM   Left Shoulder Flexion  158 Degrees    Left Shoulder ABduction  181 Degrees    Left Shoulder Internal Rotation  90 Degrees    Left Shoulder External Rotation  75 Degrees                   [facility] Adult PT Treatment/Exercise - 02/22/19 1146      Shoulder Exercises: Standing   Horizontal ABduction  AAROM;Right;15 reps    Horizontal ABduction Limitations  1# bar for stretch    Extension  Both;AAROM;15 reps   1# bar   Extension Weight (lbs)  palms facing forward      Shoulder Exercises: ROM/Strengthening   UBE (Upper Arm Bike)  L 2.5 x 5 min (2.5" each direction)      Shoulder Exercises: Stretch   Other Shoulder Stretches  doorway stretch unilat of Rt side 30 sec X 3 gentle    Other Shoulder Stretches  high doorway stretch 3 x 30 sec      Manual Therapy   Joint Mobilization  gentle grades 1-2 A/P, P/A and inf mobs    Passive ROM  Rt shoulder flexion/abduction/er/ir to tolerance               PT Short Term Goals - 02/22/19 1240      PT SHORT TERM GOAL #1   Title  improve Rt shoulder PROM by at least 15 deg all motions for improved function    Baseline  1/6: AROM  near normal values    Status  Achieved    Target Date  02/28/19        PT Long Term Goals - 01/31/19 0944      PT LONG TERM GOAL #1   Title  independent with advanced HEP    Status  New    Target Date  03/28/19      PT LONG TERM GOAL #2   Title  improve Rt shoulder AROM to within 80% of Lt shoulder for improved function and mobility    Status  New    Target Date  03/28/19      PT LONG TERM GOAL #3   Title  demonstrate 4/5 Rt shoulder strength for improved function    Status  New    Target Date  03/28/19      PT LONG TERM GOAL #4   Title  report pain < 3/10 with activity for improved function    Status  New    Target Date  03/28/19            Plan - 02/22/19 1240    Clinical Impression Statement  Pt overall progressing well with PT, with limitations most noted with abduction and external rotation.  Pt will continue to benefit from PT to maximize function.    Personal Factors and Comorbidities  Comorbidity 1    Comorbidities  OA    Examination-Activity Limitations  Lift;Reach Overhead;Carry    Examination-Participation Restrictions   Yard Work;Other;Community Activity   fitness   Stability/Clinical Decision Making  Stable/Uncomplicated    Rehab Potential  Good    PT Frequency  2x / week    PT Duration  8 weeks    PT Treatment/Interventions  ADLs/Self Care Home Management;Cryotherapy;Electrical Stimulation;Ultrasound;Moist Heat;Functional mobility training;Therapeutic activities;Therapeutic exercise;Patient/family education;Neuromuscular re-education;Manual techniques;Scar mobilization;Vasopneumatic Device;Taping;Passive range of motion;Dry needling    PT Next Visit Plan  add strengthening to HEP, stretches to address pec tightness    PT Home Exercise Plan  Access Code: [email or web address]    Consulted and Agree with Plan of Care  Patient       Patient will benefit from skilled therapeutic intervention in order to improve the following deficits and impairments:  Pain, Postural dysfunction, Impaired UE functional use, Decreased strength, Decreased range of motion  Visit Diagnosis: Acute pain of right shoulder  Abnormal posture  Stiffness of right shoulder, not elsewhere classified  Muscle weakness (generalized)     Problem List Patient Active Problem List   Diagnosis Date Noted  . LTBI (latent tuberculosis infection) 04/21/2017      Laureen Abrahams, PT, DPT 02/22/19 12:43 PM     [facility] Physical Therapy [address] [place], [place], [address] Phone: [phone]   Fax:  [phone]  Name: [name] MRN: [id number] Date of Birth: [birth year]

## 2019-02-23 ENCOUNTER — Encounter: Payer: Self-pay | Admitting: Orthopedic Surgery

## 2019-02-23 NOTE — Progress Notes (Signed)
   Post-Op Visit Note   Patient: [name]           Date of Birth: 1960/12/05           MRN: [id number] Visit Date: 02/22/2019 PCP: [name], MD   Assessment & Plan:  Chief Complaint:  Chief Complaint  Patient presents with  . Right Shoulder - Follow-up   Visit Diagnoses:  1. Other closed displaced fracture of proximal end of right humerus, initial encounter     Plan: [name] is now about 3 months out right shoulder open reduction internal fixation.  He is doing reasonably well.  He is in physical therapy 1 time a week to work on strengthening and range of motion.  On examination he does have forward flexion abduction both above 90 degrees.  Incision is intact.  Radiographs look good.  Rotator cuff strength is excellent infraspinatus supraspinatus and subscap muscle testing.  Plan at this time is to continue rehabilitation changing over to home exercise program.  We discussed the possibility of developing avascular necrosis which would be manifested as course pain and mechanical symptoms.  Follow-up with me as needed.  Follow-Up Instructions: Return if symptoms worsen or fail to improve.   Orders:  Orders Placed This Encounter  Procedures  . XR Shoulder Right   No orders of the defined types were placed in this encounter.   Imaging: XR Shoulder Right  Result Date: 02/23/2019 AP outlet and axillary right shoulder reviewed.  Proximal humerus fixation in good position alignment.  No change in greater tuberosity fragment ossification at the superior aspect of the plate.  No lucencies around the pegs.  Shoulder is located.   PMFS History: Patient Active Problem List   Diagnosis Date Noted  . LTBI (latent tuberculosis infection) 04/21/2017   Past Medical History:  Diagnosis Date  . Arthritis    back  . History of kidney stones   . LTBI (latent tuberculosis infection)    treated (latent)    Family History  Problem Relation Age of Onset  . Other Mother    latent TB  . Rheum arthritis Mother   . Heart failure Father   . Renal Disease Father     Past Surgical History:  Procedure Laterality Date  . COLONOSCOPY    . HEMANGIOMA EXCISION     Dr. [name]  . LAMINECTOMY AND MICRODISCECTOMY LUMBAR SPINE  1999   L5-S1  . ORIF SHOULDER FRACTURE Right 11/29/2018   Procedure: RIGHT OPEN REDUCTION INTERNAL FIXATION (ORIF) SHOULDER FRACTURE;  Surgeon: [name], MD;  Location: [place];  Service: Orthopedics;  Laterality: Right;   Social History   Occupational History  . Not on file  Tobacco Use  . Smoking status: Current Some Day Smoker    Types: Cigars  . Smokeless tobacco: Never Used  . Tobacco comment: occ  Substance and Sexual Activity  . Alcohol use: Yes    Alcohol/week: 2.0 standard drinks    Types: 2 Standard drinks or equivalent per week    Comment: occasional  . Drug use: No  . Sexual activity: Not on file

## 2019-02-27 ENCOUNTER — Telehealth: Payer: Self-pay | Admitting: Physical Therapy

## 2019-02-27 ENCOUNTER — Encounter: Payer: No Typology Code available for payment source | Admitting: Physical Therapy

## 2019-02-27 NOTE — Telephone Encounter (Signed)
LVM for pt due to NS for appt.  Requested return call about future PT appts as it looks like MD may have cleared him.   Laureen Abrahams, PT, DPT 02/27/19 9:21 AM

## 2019-03-01 ENCOUNTER — Ambulatory Visit: Payer: No Typology Code available for payment source | Admitting: Physical Therapy

## 2019-03-01 ENCOUNTER — Other Ambulatory Visit: Payer: Self-pay

## 2019-03-01 DIAGNOSIS — M25511 Pain in right shoulder: Secondary | ICD-10-CM

## 2019-03-01 DIAGNOSIS — M25611 Stiffness of right shoulder, not elsewhere classified: Secondary | ICD-10-CM

## 2019-03-01 DIAGNOSIS — M6281 Muscle weakness (generalized): Secondary | ICD-10-CM | POA: Diagnosis not present

## 2019-03-01 DIAGNOSIS — R293 Abnormal posture: Secondary | ICD-10-CM | POA: Diagnosis not present

## 2019-03-01 NOTE — Therapy (Signed)
[facility] Physical Therapy [address] [place], [place], [address] Phone: [phone]   Fax:  [phone]  Physical Therapy Treatment  Patient Details  Name: [name] MRN: [id number] Date of Birth: [birth year] Referring Provider (PT): Donella Stade, [place]   Encounter Date: 03/01/2019  PT End of Session - 03/01/19 1055    Visit Number  6    Number of Visits  16    Date for PT Re-Evaluation  03/28/19    PT Start Time  0850    PT Stop Time  0930    PT Time Calculation (min)  40 min    Activity Tolerance  Patient tolerated treatment well    Behavior During Therapy  [facility] for tasks assessed/performed       Past Medical History:  Diagnosis Date  . Arthritis    back  . History of kidney stones   . LTBI (latent tuberculosis infection)    treated (latent)    Past Surgical History:  Procedure Laterality Date  . COLONOSCOPY    . HEMANGIOMA EXCISION     Dr. [name]  . LAMINECTOMY AND MICRODISCECTOMY LUMBAR SPINE  1999   L5-S1  . ORIF SHOULDER FRACTURE Right 11/29/2018   Procedure: RIGHT OPEN REDUCTION INTERNAL FIXATION (ORIF) SHOULDER FRACTURE;  Surgeon: [name], MD;  Location: [place];  Service: Orthopedics;  Laterality: Right;    There were no vitals filed for this visit.  Subjective Assessment - 03/01/19 1039    Subjective  MD was pleased with my progress, I have no pain coming in but I know I still need some more ROM and strengthening. I think 2 more visits is needed.         [facility] PT Assessment - 03/01/19 0001      Assessment   Medical Diagnosis  Rt humerus ORIF     Referring Provider (PT)  Magnant, Gerrianne Scale, PA-C      AROM   Left Shoulder Flexion  165 Degrees                   [facility] Adult PT Treatment/Exercise - 03/01/19 0001      Shoulder Exercises: Prone   Flexion  Right;15 reps    Horizontal ABduction 1  Right;15 reps      Shoulder Exercises: Standing   External Rotation  Right;15 reps    Theraband  Level (Shoulder External Rotation)  Level 3 (Green)    Flexion  Right;15 reps    Shoulder Flexion Weight (lbs)  2    ABduction  Right;15 reps    Shoulder ABduction Weight (lbs)  2    Extension  20 reps    Theraband Level (Shoulder Extension)  Level 3 (Green)    Row  20 reps    Theraband Level (Shoulder Row)  Level 4 (Blue)      Shoulder Exercises: Pulleys   Flexion  2 minutes    ABduction  2 minutes      Shoulder Exercises: ROM/Strengthening   UBE (Upper Arm Bike)  L4 for 2 min fwd, 2 min retro             PT Education - 03/01/19 1054    Education Details  HEP progresssion    Person(s) Educated  Patient    Methods  Explanation;Demonstration;Verbal cues;Handout    Comprehension  Verbalized understanding;Returned demonstration       PT Short Term Goals - 02/22/19 1240      PT SHORT TERM GOAL #1  Title  improve Rt shoulder PROM by at least 15 deg all motions for improved function    Baseline  1/6: AROM near normal values    Status  Achieved    Target Date  02/28/19        PT Long Term Goals - 01/31/19 0944      PT LONG TERM GOAL #1   Title  independent with advanced HEP    Status  New    Target Date  03/28/19      PT LONG TERM GOAL #2   Title  improve Rt shoulder AROM to within 80% of Lt shoulder for improved function and mobility    Status  New    Target Date  03/28/19      PT LONG TERM GOAL #3   Title  demonstrate 4/5 Rt shoulder strength for improved function    Status  New    Target Date  03/28/19      PT LONG TERM GOAL #4   Title  report pain < 3/10 with activity for improved function    Status  New    Target Date  03/28/19            Plan - 03/01/19 1055    Clinical Impression Statement  He is making great progress and was able to progress his strengthening and strething program with good tolerance. His HEP was progressed to reflect his progress. Plan is for one more visit then transition to discharge to HEP    Personal Factors and  Comorbidities  Comorbidity 1    Comorbidities  OA    Examination-Activity Limitations  Lift;Reach Overhead;Carry    Examination-Participation Restrictions  Yard Work;Other;Community Activity   fitness   Stability/Clinical Decision Making  Stable/Uncomplicated    Rehab Potential  Good    PT Frequency  2x / week    PT Duration  8 weeks    PT Treatment/Interventions  ADLs/Self Care Home Management;Cryotherapy;Electrical Stimulation;Ultrasound;Moist Heat;Functional mobility training;Therapeutic activities;Therapeutic exercise;Patient/family education;Neuromuscular re-education;Manual techniques;Scar mobilization;Vasopneumatic Device;Taping;Passive range of motion;Dry needling    PT Next Visit Plan  may be ready for dischage    PT Home Exercise Plan  Access Code: [email or web address]    Consulted and Agree with Plan of Care  Patient       Patient will benefit from skilled therapeutic intervention in order to improve the following deficits and impairments:  Pain, Postural dysfunction, Impaired UE functional use, Decreased strength, Decreased range of motion  Visit Diagnosis: Acute pain of right shoulder  Abnormal posture  Stiffness of right shoulder, not elsewhere classified  Muscle weakness (generalized)     Problem List Patient Active Problem List   Diagnosis Date Noted  . LTBI (latent tuberculosis infection) 04/21/2017    Silvestre Mesi 03/01/2019, 10:58 AM  [facility] Physical Therapy [address] [place], [place], [address] Phone: [phone]   Fax:  [phone]  Name: [name] MRN: [id number] Date of Birth: 12-25-1960

## 2019-03-01 NOTE — Patient Instructions (Signed)
Access Code: [email or web address]  URL: [email or web address]  Date: 03/01/2019  Prepared by: Elsie Ra   Exercises  Supine Shoulder External Rotation with Dowel - 10 reps - 1 sets - 1-2 sec hold - 2x daily - 7x weekly  Doorway Pec Stretch at 90 Degrees Abduction - 3 reps - 1 sets - 30 hold - 2x daily - 6x weekly  Standing Shoulder Flexion Wall Walk - 10 reps - 1 sets - 5 sec hold - 2x daily - 6x weekly  Standing Shoulder Flexion with Dumbbells - 10 reps - 1-2 sets - 2x daily - 6x weekly  Shoulder Abduction with Dumbbells - Thumbs Up - 10 reps - 1-2 sets - 2x daily - 6x weekly  Standing Row with Resistance - 10 reps - 2-3 sets - 2x daily - 6x weekly  Shoulder Extension with Resistance - 10 reps - 2-3 sets - 2x daily - 6x weekly  Shoulder External Rotation with Anchored Resistance - 10 reps - 1-2 sets - 2x daily - 6x weekly  Prone Single Arm Shoulder Y - 10 reps - 1-2 sets - 2x daily - 6x weekly  Prone Shoulder Horizontal Abduction - 10 reps - 1-2 sets - 2x daily - 6x weekly

## 2019-03-06 ENCOUNTER — Other Ambulatory Visit: Payer: Self-pay

## 2019-03-06 ENCOUNTER — Encounter: Payer: Self-pay | Admitting: Physical Therapy

## 2019-03-06 ENCOUNTER — Ambulatory Visit: Payer: No Typology Code available for payment source | Admitting: Physical Therapy

## 2019-03-06 DIAGNOSIS — M6281 Muscle weakness (generalized): Secondary | ICD-10-CM | POA: Diagnosis not present

## 2019-03-06 DIAGNOSIS — M25611 Stiffness of right shoulder, not elsewhere classified: Secondary | ICD-10-CM | POA: Diagnosis not present

## 2019-03-06 DIAGNOSIS — M25511 Pain in right shoulder: Secondary | ICD-10-CM | POA: Diagnosis not present

## 2019-03-06 DIAGNOSIS — R293 Abnormal posture: Secondary | ICD-10-CM | POA: Diagnosis not present

## 2019-03-06 NOTE — Patient Instructions (Signed)
Access Code: [email or web address]  URL: [email or web address]  Date: 03/06/2019  Prepared by: Faustino Congress   Exercises Supine Shoulder External Rotation with Dowel - 10 reps - 1 sets - 1-2 sec hold - 2x daily - 7x weekly Doorway Pec Stretch at 90 Degrees Abduction - 3 reps - 1 sets - 30 hold - 2x daily - 6x weekly Standing Shoulder Flexion with Dumbbells - 10 reps - 1-2 sets - 2x daily - 6x weekly Shoulder Abduction with Dumbbells - Thumbs Up - 10 reps - 1-2 sets - 2x daily - 6x weekly Shoulder Overhead Press in Flexion with Dumbbells - 10 reps - 2 sets - 2x daily - 7x weekly Standing Row with Resistance - 10 reps - 2-3 sets - 2x daily - 6x weekly Shoulder Extension with Resistance - 10 reps - 2-3 sets - 2x daily - 6x weekly Shoulder External Rotation with Anchored Resistance - 10 reps - 1-2 sets - 2x daily - 6x weekly Prone Single Arm Shoulder Y - 10 reps - 1-2 sets - 2x daily - 6x weekly Prone Shoulder Horizontal Abduction - 10 reps - 1-2 sets - 2x daily - 6x weekly

## 2019-03-06 NOTE — Therapy (Signed)
[facility] Physical Therapy [address] [place], [place], [address] Phone: [phone]   Fax:  [phone]  Physical Therapy Treatment/Discharge Summary  Patient Details  Name: [name] MRN: [id number] Date of Birth: [birth year] Referring Provider (PT): Donella Stade, [place]   Encounter Date: 03/06/2019  PT End of Session - 03/06/19 0924    Visit Number  7    Number of Visits  16    Date for PT Re-Evaluation  03/28/19    PT Start Time  0845    PT Stop Time  0914    PT Time Calculation (min)  29 min    Activity Tolerance  Patient tolerated treatment well    Behavior During Therapy  [facility] for tasks assessed/performed       Past Medical History:  Diagnosis Date  . Arthritis    back  . History of kidney stones   . LTBI (latent tuberculosis infection)    treated (latent)    Past Surgical History:  Procedure Laterality Date  . COLONOSCOPY    . HEMANGIOMA EXCISION     Dr. [name]  . LAMINECTOMY AND MICRODISCECTOMY LUMBAR SPINE  1999   L5-S1  . ORIF SHOULDER FRACTURE Right 11/29/2018   Procedure: RIGHT OPEN REDUCTION INTERNAL FIXATION (ORIF) SHOULDER FRACTURE;  Surgeon: [name], MD;  Location: [place];  Service: Orthopedics;  Laterality: Right;    There were no vitals filed for this visit.  Subjective Assessment - 03/06/19 0846    Subjective  ready for d/c today.    Patient Stated Goals  regain motion and use of Rt shoulder; be able to cast fly fishing rod; get into/out of scuba gear    Currently in Pain?  No/denies         [facility] PT Assessment - 03/06/19 0852      Assessment   Medical Diagnosis  Rt humerus ORIF     Referring Provider (PT)  Magnant, Gerrianne Scale, PA-C    Onset Date/Surgical Date  11/29/18      AROM   Right/Left Shoulder  Right    Right Shoulder Flexion  160 Degrees    Right Shoulder ABduction  162 Degrees    Right Shoulder Internal Rotation  77 Degrees    Right Shoulder External Rotation  60 Degrees      Strength   Strength Assessment Site  Shoulder    Right/Left Shoulder  Right    Right Shoulder Flexion  4/5    Right Shoulder ABduction  4/5    Right Shoulder Internal Rotation  5/5    Right Shoulder External Rotation  5/5                   [facility] Adult PT Treatment/Exercise - 03/06/19 0849      Shoulder Exercises: Standing   Flexion  Right;15 reps    Shoulder Flexion Weight (lbs)  5    ABduction  Right;15 reps    Shoulder ABduction Weight (lbs)  5    Other Standing Exercises  overhead press x 15 reps; 5# on Rt      Shoulder Exercises: ROM/Strengthening   UBE (Upper Arm Bike)  L5 for 2 min fwd, 2 min retro      Manual Therapy   Passive ROM  Rt shoulder er/ir             PT Education - 03/06/19 0924    Education Details  updated HEP    Person(s) Educated  Patient    Methods  Explanation;Demonstration;Handout    Comprehension  Verbalized understanding       PT Short Term Goals - 02/22/19 1240      PT SHORT TERM GOAL #1   Title  improve Rt shoulder PROM by at least 15 deg all motions for improved function    Baseline  1/6: AROM near normal values    Status  Achieved    Target Date  02/28/19        PT Long Term Goals - 03/06/19 0925      PT LONG TERM GOAL #1   Title  independent with advanced HEP    Status  Achieved      PT LONG TERM GOAL #2   Title  improve Rt shoulder AROM to within 80% of Lt shoulder for improved function and mobility    Baseline  1/18: all met except external rotation    Status  Achieved      PT LONG TERM GOAL #3   Title  demonstrate 4/5 Rt shoulder strength for improved function    Status  Achieved      PT LONG TERM GOAL #4   Title  report pain < 3/10 with activity for improved function    Status  Achieved            Plan - 03/06/19 0925    Clinical Impression Statement  Pt has met/partially met all goals and is ready for d/c at this time.  Pt to continue with HEP to maximize function.    Personal Factors and  Comorbidities  Comorbidity 1    Comorbidities  OA    Examination-Activity Limitations  Lift;Reach Overhead;Carry    Examination-Participation Restrictions  Yard Work;Other;Community Activity   fitness   Stability/Clinical Decision Making  Stable/Uncomplicated    Rehab Potential  Good    PT Frequency  2x / week    PT Duration  8 weeks    PT Treatment/Interventions  ADLs/Self Care Home Management;Cryotherapy;Electrical Stimulation;Ultrasound;Moist Heat;Functional mobility training;Therapeutic activities;Therapeutic exercise;Patient/family education;Neuromuscular re-education;Manual techniques;Scar mobilization;Vasopneumatic Device;Taping;Passive range of motion;Dry needling    PT Next Visit Plan  d/c PT today    PT Home Exercise Plan  Access Code: [email or web address]    Consulted and Agree with Plan of Care  Patient       Patient will benefit from skilled therapeutic intervention in order to improve the following deficits and impairments:  Pain, Postural dysfunction, Impaired UE functional use, Decreased strength, Decreased range of motion  Visit Diagnosis: Acute pain of right shoulder  Abnormal posture  Stiffness of right shoulder, not elsewhere classified  Muscle weakness (generalized)     Problem List Patient Active Problem List   Diagnosis Date Noted  . LTBI (latent tuberculosis infection) 04/21/2017      Laureen Abrahams, PT, DPT 03/06/19 9:26 AM    [facility] Physical Therapy [address] [place], [place], [address] Phone: [phone]   Fax:  [phone]  Name: [name] MRN: [id number] Date of Birth: [birth year]     PHYSICAL THERAPY DISCHARGE SUMMARY  Visits from Start of Care: 7  Current functional level related to goals / functional outcomes: See above   Remaining deficits: See above   Education / Equipment: HEP  Plan: Patient agrees to discharge.  Patient goals were met. Patient is being discharged due to meeting the stated  rehab goals.  ?????    Laureen Abrahams, PT, DPT 03/06/19 9:26 AM [facility] Physical Therapy [address] [place], [place], [address] Phone: [phone]  Fax:  [phone]

## 2019-03-08 ENCOUNTER — Encounter: Payer: No Typology Code available for payment source | Admitting: Physical Therapy

## 2019-07-06 IMAGING — CR DG CHEST 2V
2 series · 2 of 2 positions shown · non-contrast
Comparison: None in PACs

CLINICAL DATA: Positive TB blood test 2 weeks ago. The patient is
asymptomatic. Long-term history of positive TB skin test.

EXAM:
CHEST  2 VIEW

[w chest pa]
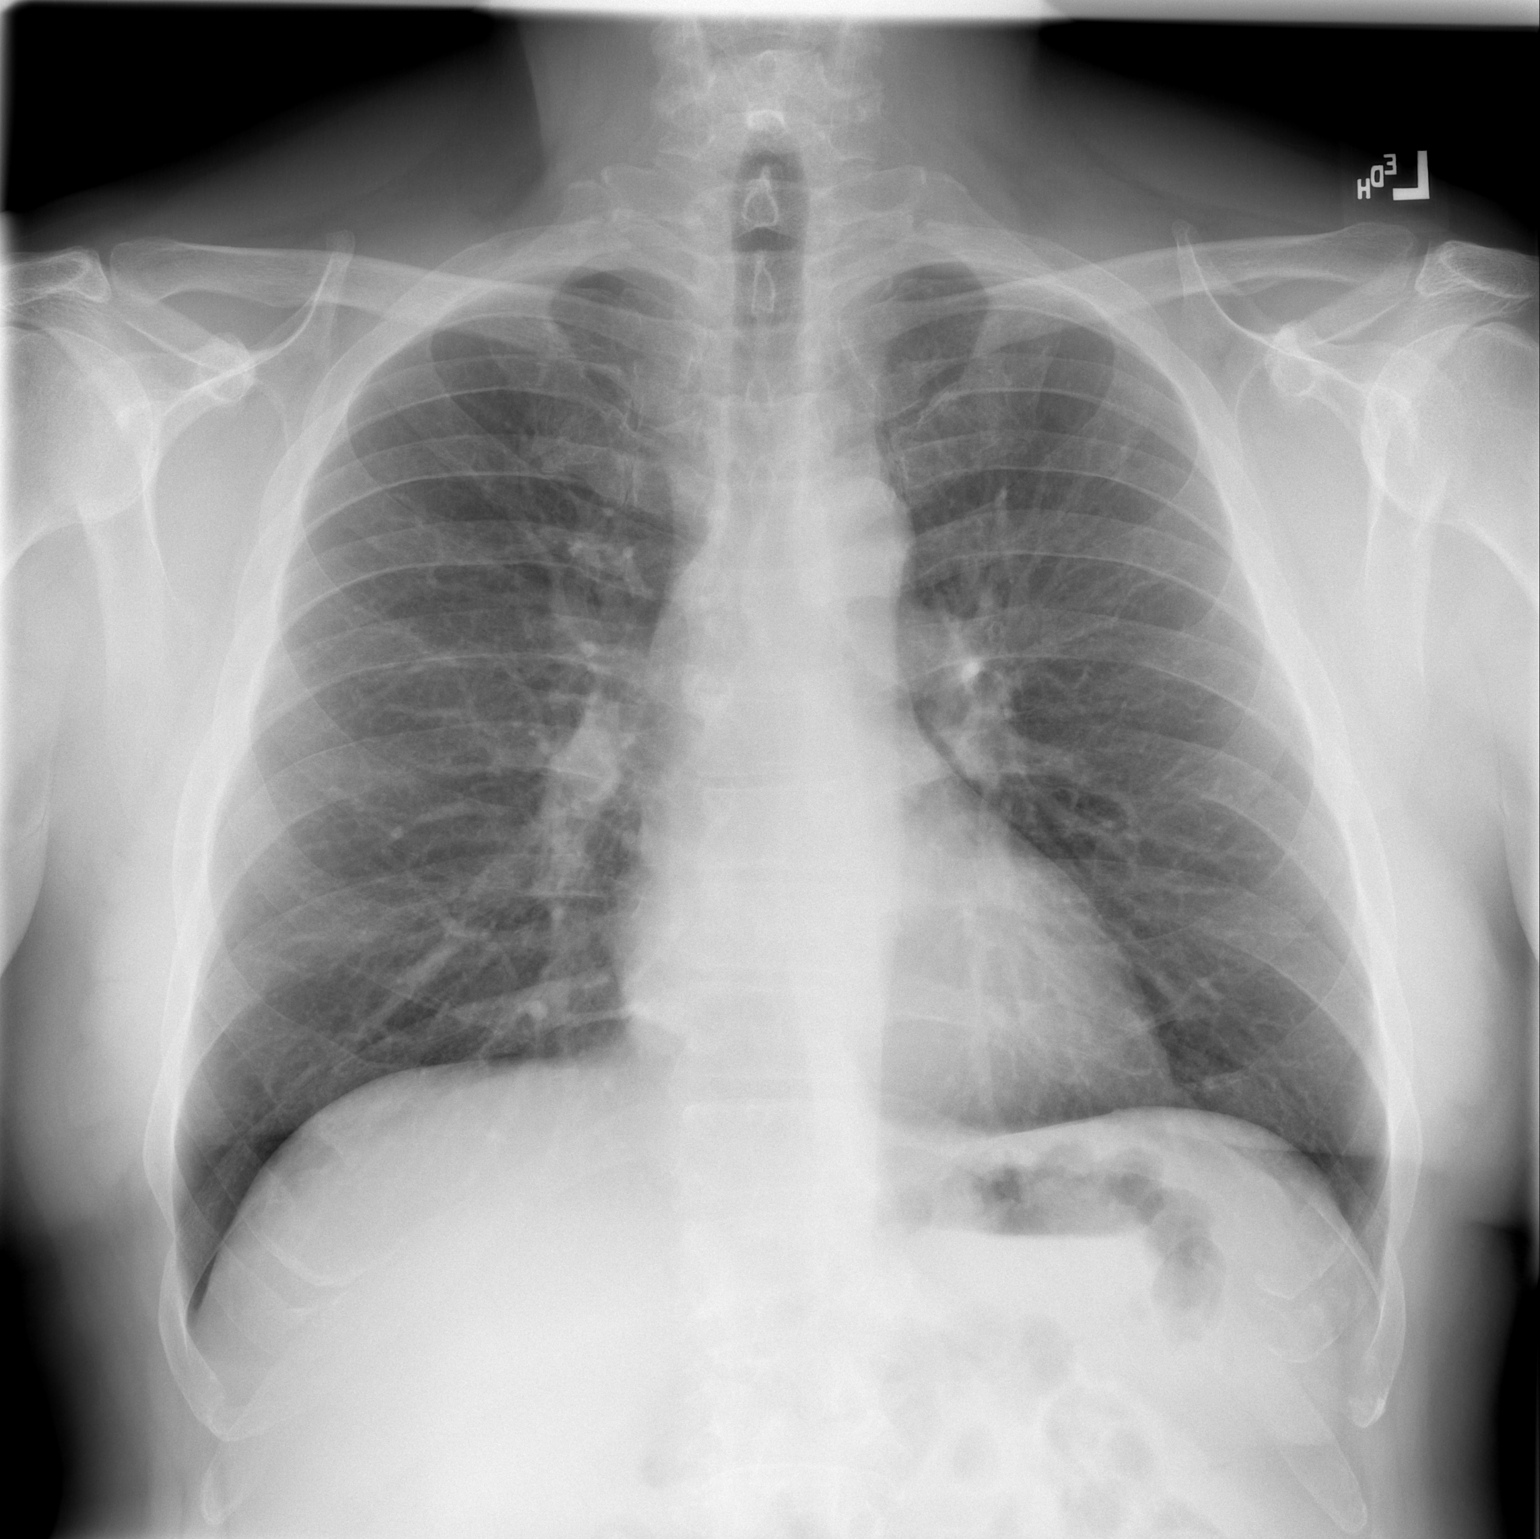

[w chest lat]
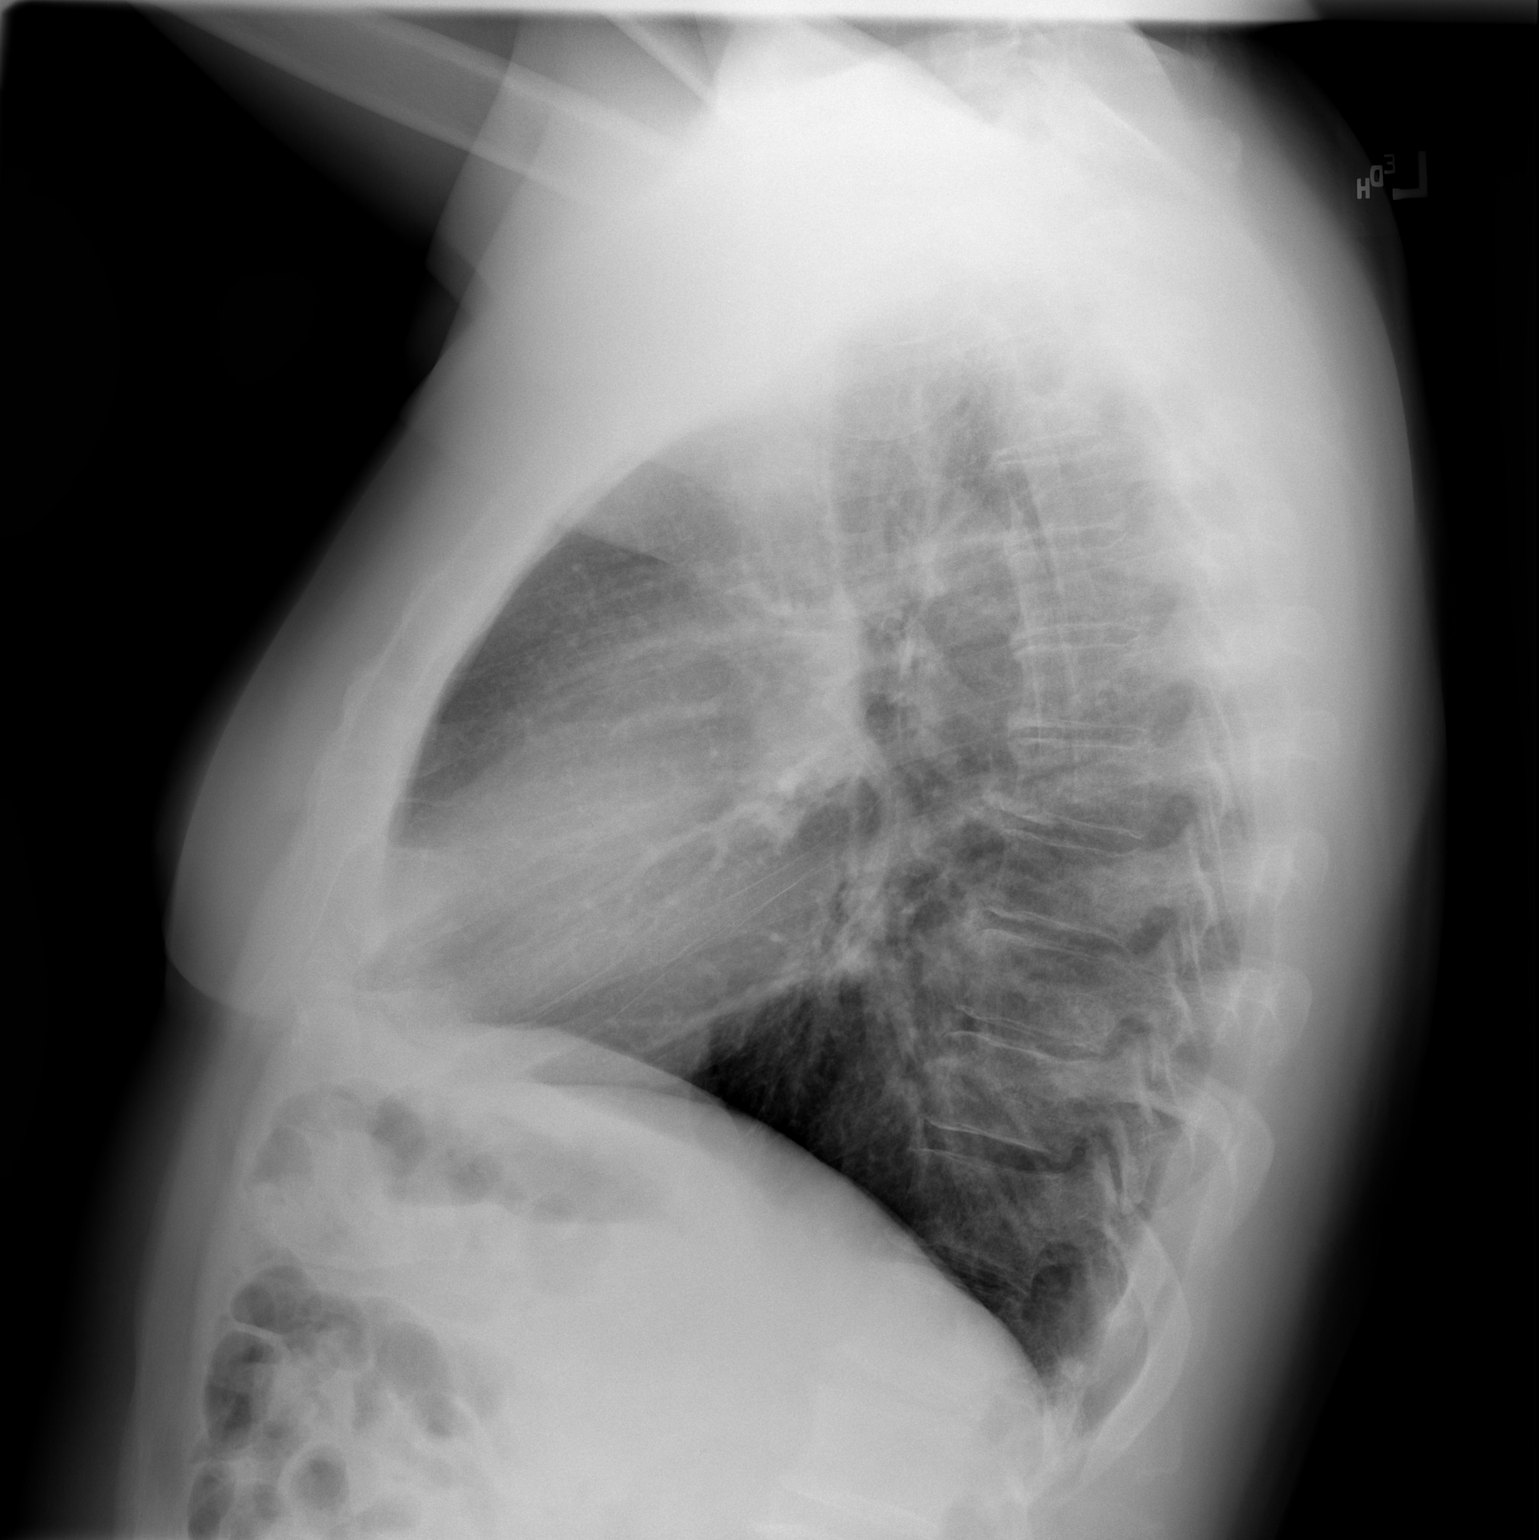

[2 of 2 positions shown; findings below may reference images not displayed]

FINDINGS: The lungs are well-expanded and clear. The heart and pulmonary
vascularity are normal. The mediastinum is normal in width. There is
no pleural effusion. The bony thorax is unremarkable.
IMPRESSION: There is no evidence of acute or old tuberculous infection nor other
active cardiopulmonary disease.

## 2019-10-18 ENCOUNTER — Ambulatory Visit: Payer: Self-pay

## 2019-10-18 ENCOUNTER — Ambulatory Visit: Payer: No Typology Code available for payment source | Admitting: Orthopaedic Surgery

## 2019-10-18 ENCOUNTER — Ambulatory Visit (INDEPENDENT_AMBULATORY_CARE_PROVIDER_SITE_OTHER): Payer: No Typology Code available for payment source

## 2019-10-18 ENCOUNTER — Encounter: Payer: Self-pay | Admitting: Orthopaedic Surgery

## 2019-10-18 DIAGNOSIS — M25551 Pain in right hip: Secondary | ICD-10-CM

## 2019-10-18 DIAGNOSIS — M1611 Unilateral primary osteoarthritis, right hip: Secondary | ICD-10-CM

## 2019-10-18 NOTE — Progress Notes (Signed)
Subjective: Patient is here for ultrasound-guided intra-articular right hip injection.   Prior injection helped until a couple months ago.  Objective:  Pain with IR.  Procedure: Ultrasound-guided right hip injection: After sterile prep with Betadine, injected 8 cc 1% lidocaine without epinephrine and 80 mg methylprednisolone using a 22-gauge spinal needle, passing the needle through the iliofemoral ligament into the femoral head/neck junction.  Injectate seen filling joint capsule.

## 2019-10-18 NOTE — Progress Notes (Signed)
Office Visit Note   Patient: [name]           Date of Birth: Dec 10, 1960           MRN: [id number] Visit Date: 10/18/2019              Requested by: [name], MD [address] [place],  [place] [address] PCP: [name], MD   Assessment & Plan: Visit Diagnoses:  1. Pain in right hip   2. Unilateral primary osteoarthritis, right hip     Plan: Plan we will try a trochanteric injection today along with a intra-articular injection under ultrasound.  He will follow up with [place] as needed.  Discussed with him total hip arthroplasty which would be the next step if he fails all conservative treatment.  He has multiple trips planned would like to avoid surgery for now if possible.  Questions were encouraged and answered at length.  Follow-up as needed  Follow-Up Instructions: Return if symptoms worsen or fail to improve.   Orders:  Orders Placed This Encounter  Procedures  . Large Joint Inj: R greater trochanter  . XR HIP UNILAT W OR W/O PELVIS 2-3 VIEWS RIGHT  . US Guided Needle Placement - No Linked Charges   No orders of the defined types were placed in this encounter.     Procedures: Large Joint Inj: R greater trochanter on 10/18/2019 8:51 AM Indications: pain Details: 22 G 1.5 in needle, lateral approach  Arthrogram: No  Medications: 3 mL lidocaine 1 %; 40 mg methylPREDNISolone acetate 40 MG/ML Outcome: tolerated well, no immediate complications Procedure, treatment alternatives, risks and benefits explained, specific risks discussed. Consent was given by the patient. Immediately prior to procedure a time out was called to verify the correct patient, procedure, equipment, support staff and site/side marked as required. Patient was prepped and draped in the usual sterile fashion.       Clinical Data: No additional findings.   Subjective: Chief Complaint  Patient presents with  . Right Hip - Pain    HPI [name] comes in today due to right hip pain  really have not seen him since 2020 when he had a ultrasound-guided right hip injection due to hip femoral acetabular impingement.  Patient did not really good relief until just recently.  He is now having pain in his right groin area and right buttocks region.  No radicular symptoms down the leg.  Has decreased range of motion has difficulty crossing his legs particularly on the right side.  Whenever he rolls over on the right hip he does have pain on the lateral side.  Notes his pain has been worse over the last 3 months.  Review of Systems  Constitutional: Negative for chills and fever.  Respiratory: Negative for shortness of breath.   Musculoskeletal: Positive for arthralgias.     Objective: Vital Signs: There were no vitals taken for this visit.  Physical Exam Constitutional:      Appearance: He is not ill-appearing or diaphoretic.  Pulmonary:     Effort: Pulmonary effort is normal.  Neurological:     Mental Status: He is alert and oriented to person, place, and time.  Psychiatric:        Mood and Affect: Mood normal.     Ortho Exam Hips is limited internal rotation of both hips but discomfort with internal rotation of the right hip.  Ambulates without any assistive device.  Tenderness over the right trochanteric region.  Specialty Comments:  No specialty comments available.  Imaging: US Guided Needle Placement - No Linked Charges  Result Date: 10/18/2019  Ultrasound-guided right hip injection: After sterile prep with Betadine, injected 8 cc 1% lidocaine without epinephrine and 80 mg methylprednisolone using a 22-gauge spinal needle, passing the needle through the iliofemoral ligament into the femoral head/neck junction.  Injectate seen filling joint capsule.    XR HIP UNILAT W OR W/O PELVIS 2-3 VIEWS RIGHT  Result Date: 10/18/2019 Right lateral hip and AP pelvis: Bilateral hips well located.  Narrowing of the right hip joint mild to moderate.  Osteophytes off the acetabulum  superiorly and inferiorly.  No acute fractures.  Right femoral head is slightly misshapen compared to the left hip which appears completely normal.    PMFS History: Patient Active Problem List   Diagnosis Date Noted  . LTBI (latent tuberculosis infection) 04/21/2017   Past Medical History:  Diagnosis Date  . Arthritis    back  . History of kidney stones   . LTBI (latent tuberculosis infection)    treated (latent)    Family History  Problem Relation Age of Onset  . Other Mother        latent TB  . Rheum arthritis Mother   . Heart failure Father   . Renal Disease Father     Past Surgical History:  Procedure Laterality Date  . COLONOSCOPY    . HEMANGIOMA EXCISION     Dr. [name]  . LAMINECTOMY AND MICRODISCECTOMY LUMBAR SPINE  1999   L5-S1  . ORIF SHOULDER FRACTURE Right 11/29/2018   Procedure: RIGHT OPEN REDUCTION INTERNAL FIXATION (ORIF) SHOULDER FRACTURE;  Surgeon: [name], MD;  Location: [place];  Service: Orthopedics;  Laterality: Right;   Social History   Occupational History  . Not on file  Tobacco Use  . Smoking status: Current Some Day Smoker    Types: Cigars  . Smokeless tobacco: Never Used  . Tobacco comment: occ  Vaping Use  . Vaping Use: Never used  Substance and Sexual Activity  . Alcohol use: Yes    Alcohol/week: 2.0 standard drinks    Types: 2 Standard drinks or equivalent per week    Comment: occasional  . Drug use: No  . Sexual activity: Not on file

## 2019-10-19 MED ORDER — LIDOCAINE HCL 1 % IJ SOLN
3.0000 mL | INTRAMUSCULAR | Status: AC | PRN
  Administered 2019-10-18: 3 mL

## 2019-10-19 MED ORDER — METHYLPREDNISOLONE ACETATE 40 MG/ML IJ SUSP
40.0000 mg | INTRAMUSCULAR | Status: AC | PRN
  Administered 2019-10-18: 40 mg via INTRA_ARTICULAR

## 2019-11-13 ENCOUNTER — Ambulatory Visit: Payer: No Typology Code available for payment source | Admitting: Orthopaedic Surgery

## 2019-11-13 VITALS — Ht 70.0 in | Wt 220.0 lb

## 2019-11-13 DIAGNOSIS — M25551 Pain in right hip: Secondary | ICD-10-CM | POA: Diagnosis not present

## 2019-11-13 DIAGNOSIS — M1611 Unilateral primary osteoarthritis, right hip: Secondary | ICD-10-CM

## 2019-11-13 NOTE — Progress Notes (Signed)
Office Visit Note   Patient: [name]           Date of Birth: May 03, 1960           MRN: [id number] Visit Date: 11/13/2019              Requested by: [name], MD [address] [place],  [place] [address] PCP: [name], MD   Assessment & Plan: Visit Diagnoses:  1. Pain in right hip   2. Unilateral primary osteoarthritis, right hip     Plan: At this point we both agree that hip replacement surgery is a good recommendation for him given the failed conservative treatment combined with his known osteoarthritis of the right hip and the detrimental effects his right hip pain is having on his mobility, his quality of life and his actives daily living.  I showed him a hip model and went over his x-rays and explained in detail what the surgery involves.  We talked about the risk and benefits of surgery and what to expect in the interoperative and postoperative periods.  I do feel that he is a good candidate for same-day surgery as well.  All questions and concerns were answered and addressed.  He would like to have this scheduled in the month of October.   Follow-Up Instructions: Return for 2 weeks post-op.   Orders:  No orders of the defined types were placed in this encounter.  No orders of the defined types were placed in this encounter.     Procedures: No procedures performed   Clinical Data: No additional findings.   Subjective: Chief Complaint  Patient presents with  . Right Hip - Pain  The patient is actually well-known to me.  He has known severe arthritis in his right hip.  He tried conservative treatment with activity modification and stretching.  We also sent him for an intra-articular steroid injection that had minimal effect.  His right hip pain is in the groin.  It is definitely affecting his mobility, his quality of life and his activities day living.  He would like to proceed with hip replacement surgery.  He is very active 59 year old gentleman with  no active medical problems.  He is now diabetic and not a smoker.  He is not on blood thinning medication.  HPI  Review of Systems He currently denies any headache, chest pain, shortness of breath, fever, chills, nausea, vomiting  Objective: Vital Signs: Ht 5\' 10"  (1.778 m)   Wt 220 lb (99.8 kg)   BMI 31.57 kg/m   Physical Exam He is alert and orient x3 and in no acute distress Ortho Exam Examination of his right hip shows significant pain with internal and external rotation as well as significant stiffness as well. Specialty Comments:  No specialty comments available.  Imaging: No results found. Previous x-rays of the pelvis and right hip show severe osteoarthritis of the right hip with particular osteophytes and significant joint space narrowing as well as flattening of the superior lateral femoral head.  PMFS History: Patient Active Problem List   Diagnosis Date Noted  . LTBI (latent tuberculosis infection) 04/21/2017   Past Medical History:  Diagnosis Date  . Arthritis    back  . History of kidney stones   . LTBI (latent tuberculosis infection)    treated (latent)    Family History  Problem Relation Age of Onset  . Other Mother        latent TB  . Rheum arthritis Mother   .  Heart failure Father   . Renal Disease Father     Past Surgical History:  Procedure Laterality Date  . COLONOSCOPY    . HEMANGIOMA EXCISION     Dr. [name]  . LAMINECTOMY AND MICRODISCECTOMY LUMBAR SPINE  1999   L5-S1  . ORIF SHOULDER FRACTURE Right 11/29/2018   Procedure: RIGHT OPEN REDUCTION INTERNAL FIXATION (ORIF) SHOULDER FRACTURE;  Surgeon: [name], MD;  Location: [place];  Service: Orthopedics;  Laterality: Right;   Social History   Occupational History  . Not on file  Tobacco Use  . Smoking status: Current Some Day Smoker    Types: Cigars  . Smokeless tobacco: Never Used  . Tobacco comment: occ  Vaping Use  . Vaping Use: Never used  Substance and Sexual  Activity  . Alcohol use: Yes    Alcohol/week: 2.0 standard drinks    Types: 2 Standard drinks or equivalent per week    Comment: occasional  . Drug use: No  . Sexual activity: Not on file

## 2019-11-16 ENCOUNTER — Other Ambulatory Visit: Payer: Self-pay

## 2019-11-16 ENCOUNTER — Other Ambulatory Visit: Payer: Self-pay | Admitting: Physician Assistant

## 2019-11-16 NOTE — Progress Notes (Addendum)
COVID Vaccine Completed: Yes Date COVID Vaccine completed: 04/26/19, 05/24/2019 COVID vaccine manufacturer: [place]     PCP - Dr. [name] Cardiologist - N/A  Chest x-ray - greater than 1 year EKG - N/A Stress Test - N/A ECHO - N/A Cardiac Cath - N/A Pacemaker/ICD device last checked:N/A  Sleep Study - N/A CPAP - N/A  Fasting Blood Sugar - N/A Checks Blood Sugar __N/A___ times a day  Blood Thinner Instructions: N/A Aspirin Instructions:N/A Last Dose:N/A  Anesthesia review: N/A  Patient denies shortness of breath, fever, cough and chest pain at PAT appointment   Patient verbalized understanding of instructions that were given to them at the PAT appointment. Patient was also instructed that they will need to review over the PAT instructions again at home before surgery.

## 2019-11-16 NOTE — Patient Instructions (Addendum)
DUE TO COVID-19 ONLY ONE VISITOR IS ALLOWED TO COME WITH YOU AND STAY IN THE WAITING ROOM ONLY DURING PRE OP AND PROCEDURE.    COVID SWAB TESTING MUST BE COMPLETED ON:  Tuesday, Oct. 5, 2021 at 12:30 PM   [address] [place], [place] [address]  (Must self quarantine after testing. Follow instructions on handout.)       Your procedure is scheduled on: Friday, Oct. 8, 2021    Report to [facility] Main  Entrance   Report to Short Stay at 5:30 AM   [facility])   Call this number if you have problems the morning of surgery [phone]   Do not eat food :After Midnight.   May have liquids until 4:15AM   day of surgery  CLEAR LIQUID DIET  Foods Allowed                                                                     Foods Excluded  Water, Black Coffee and tea, regular and decaf                             liquids that you cannot  Plain Jell-O in any flavor  (No red)                                           see through such as: Fruit ices (not with fruit pulp)                                     milk, soups, orange juice              Iced Popsicles (No red)                                    All solid food                                   Apple juices Sports drinks like Gatorade (No red) Lightly seasoned clear broth or consume(fat free) Sugar, honey syrup  Sample Menu Breakfast                                Lunch                                     Supper Cranberry juice                    Beef broth                            Chicken broth Jell-O  Grape juice                           Apple juice Coffee or tea                        Jell-O                                      Popsicle                                                Coffee or tea                        Coffee or tea      Complete one Ensure drink the morning of surgery at  4:15 AM     the day of surgery.   Oral Hygiene is also important to reduce your risk of  infection.                                    Remember - BRUSH YOUR TEETH THE MORNING OF SURGERY WITH YOUR REGULAR TOOTHPASTE   Do NOT smoke after Midnight   Take these medicines the morning of surgery with A SIP OF WATER: None                               You may not have any metal on your body including  jewelry, and body piercings             Do not wear lotions, powders, perfumes/cologne, or deodorant                           Men may shave face and neck.   Do not bring valuables to the hospital. [place].   Contacts, dentures or bridgework may not be worn into surgery.    Patients discharged the day of surgery will not be allowed to drive home.   Special Instructions: Bring a copy of your healthcare power of attorney and living will documents         the day of surgery if you haven't scanned them in before.              Please read over the following fact sheets you were given: IF YOU HAVE QUESTIONS ABOUT YOUR PRE OP INSTRUCTIONS PLEASE CALL [phone]   [facility] - Preparing for Surgery Before surgery, you can play an important role.  Because skin is not sterile, your skin needs to be as free of germs as possible.  You can reduce the number of germs on your skin by washing with CHG (chlorahexidine gluconate) soap before surgery.  CHG is an antiseptic cleaner which kills germs and bonds with the skin to continue killing germs even after washing. Please DO NOT use if you have an allergy to CHG or antibacterial soaps.  If your skin becomes reddened/irritated  stop using the CHG and inform your nurse when you arrive at Short Stay. Do not shave (including legs and underarms) for at least 48 hours prior to the first CHG shower.  You may shave your face/neck.  Please follow these instructions carefully:  1.  Shower with CHG Soap the night before surgery and the  morning of surgery.  2.  If you choose to wash your hair, wash your hair  first as usual with your normal  shampoo.  3.  After you shampoo, rinse your hair and body thoroughly to remove the shampoo.                             4.  Use CHG as you would any other liquid soap.  You can apply chg directly to the skin and wash.  Gently with a scrungie or clean washcloth.  5.  Apply the CHG Soap to your body ONLY FROM THE NECK DOWN.   Do   not use on face/ open                           Wound or open sores. Avoid contact with eyes, ears mouth and   genitals (private parts).                       Wash face,  Genitals (private parts) with your normal soap.             6.  Wash thoroughly, paying special attention to the area where your    surgery  will be performed.  7.  Thoroughly rinse your body with warm water from the neck down.  8.  DO NOT shower/wash with your normal soap after using and rinsing off the CHG Soap.                9.  Pat yourself dry with a clean towel.            10.  Wear clean pajamas.            11.  Place clean sheets on your bed the night of your first shower and do not  sleep with pets. Day of Surgery : Do not apply any lotions/deodorants the morning of surgery.  Please wear clean clothes to the hospital/surgery center.  FAILURE TO FOLLOW THESE INSTRUCTIONS MAY RESULT IN THE CANCELLATION OF YOUR SURGERY  PATIENT SIGNATURE_________________________________  NURSE SIGNATURE__________________________________  ________________________________________________________________________.  Incentive Spirometer  An incentive spirometer is a tool that can help keep your lungs clear and active. This tool measures how well you are filling your lungs with each breath. Taking long deep breaths may help reverse or decrease the chance of developing breathing (pulmonary) problems (especially infection) following:  A long period of time when you are unable to move or be active. BEFORE THE PROCEDURE   If the spirometer includes an indicator to show your best  effort, your nurse or respiratory therapist will set it to a desired goal.  If possible, sit up straight or lean slightly forward. Try not to slouch.  Hold the incentive spirometer in an upright position. INSTRUCTIONS FOR USE  1. Sit on the edge of your bed if possible, or sit up as far as you can in bed or on a chair. 2. Hold the incentive spirometer in an upright position. 3. Breathe out normally. 4. Place the mouthpiece in  your mouth and seal your lips tightly around it. 5. Breathe in slowly and as deeply as possible, raising the piston or the ball toward the top of the column. 6. Hold your breath for 3-5 seconds or for as long as possible. Allow the piston or ball to fall to the bottom of the column. 7. Remove the mouthpiece from your mouth and breathe out normally. 8. Rest for a few seconds and repeat Steps 1 through 7 at least 10 times every 1-2 hours when you are awake. Take your time and take a few normal breaths between deep breaths. 9. The spirometer may include an indicator to show your best effort. Use the indicator as a goal to work toward during each repetition. 10. After each set of 10 deep breaths, practice coughing to be sure your lungs are clear. If you have an incision (the cut made at the time of surgery), support your incision when coughing by placing a pillow or rolled up towels firmly against it. Once you are able to get out of bed, walk around indoors and cough well. You may stop using the incentive spirometer when instructed by your caregiver.  RISKS AND COMPLICATIONS  Take your time so you do not get dizzy or light-headed.  If you are in pain, you may need to take or ask for pain medication before doing incentive spirometry. It is harder to take a deep breath if you are having pain. AFTER USE  Rest and breathe slowly and easily.  It can be helpful to keep track of a log of your progress. Your caregiver can provide you with a simple table to help with this. If you  are using the spirometer at home, follow these instructions: [place] IF:   You are having difficultly using the spirometer.  You have trouble using the spirometer as often as instructed.  Your pain medication is not giving enough relief while using the spirometer.  You develop fever of 100.5 F (38.1 C) or higher. SEEK IMMEDIATE MEDICAL CARE IF:   You cough up bloody sputum that had not been present before.  You develop fever of 102 F (38.9 C) or greater.  You develop worsening pain at or near the incision site. MAKE SURE YOU:   Understand these instructions.  Will watch your condition.  Will get help right away if you are not doing well or get worse. Document Released: 06/15/2006 Document Revised: 04/27/2011 Document Reviewed: 08/16/2006 Exit[employer] Patient Information 2014 Exit[employer], [place].   ________________________________________________________________________  WHAT IS A BLOOD TRANSFUSION? Blood Transfusion Information  A transfusion is the replacement of blood or some of its parts. Blood is made up of multiple cells which provide different functions.  Red blood cells carry oxygen and are used for blood loss replacement.  White blood cells fight against infection.  Platelets control bleeding.  Plasma helps clot blood.  Other blood products are available for specialized needs, such as hemophilia or other clotting disorders. BEFORE THE TRANSFUSION  Who gives blood for transfusions?   Healthy volunteers who are fully evaluated to make sure their blood is safe. This is blood bank blood. Transfusion therapy is the safest it has ever been in the practice of medicine. Before blood is taken from a donor, a complete history is taken to make sure that person has no history of diseases nor engages in risky social behavior (examples are intravenous drug use or sexual activity with multiple partners). The donor's travel history is screened to minimize risk of  transmitting infections, such as malaria. The donated blood is tested for signs of infectious diseases, such as HIV and hepatitis. The blood is then tested to be sure it is compatible with you in order to minimize the chance of a transfusion reaction. If you or a relative donates blood, this is often done in anticipation of surgery and is not appropriate for emergency situations. It takes many days to process the donated blood. RISKS AND COMPLICATIONS Although transfusion therapy is very safe and saves many lives, the main dangers of transfusion include:   Getting an infectious disease.  Developing a transfusion reaction. This is an allergic reaction to something in the blood you were given. Every precaution is taken to prevent this. The decision to have a blood transfusion has been considered carefully by your caregiver before blood is given. Blood is not given unless the benefits outweigh the risks. AFTER THE TRANSFUSION  Right after receiving a blood transfusion, you will usually feel much better and more energetic. This is especially true if your red blood cells have gotten low (anemic). The transfusion raises the level of the red blood cells which carry oxygen, and this usually causes an energy increase.  The nurse administering the transfusion will monitor you carefully for complications. HOME CARE INSTRUCTIONS  No special instructions are needed after a transfusion. You may find your energy is better. Speak with your caregiver about any limitations on activity for underlying diseases you may have. SEEK MEDICAL CARE IF:   Your condition is not improving after your transfusion.  You develop redness or irritation at the intravenous (IV) site. SEEK IMMEDIATE MEDICAL CARE IF:  Any of the following symptoms occur over the next 12 hours:  Shaking chills.  You have a temperature by mouth above 102 F (38.9 C), not controlled by medicine.  Chest, back, or muscle pain.  People around you  feel you are not acting correctly or are confused.  Shortness of breath or difficulty breathing.  Dizziness and fainting.  You get a rash or develop hives.  You have a decrease in urine output.  Your urine turns a dark color or changes to pink, red, or brown. Any of the following symptoms occur over the next 10 days:  You have a temperature by mouth above 102 F (38.9 C), not controlled by medicine.  Shortness of breath.  Weakness after normal activity.  The white part of the eye turns yellow (jaundice).  You have a decrease in the amount of urine or are urinating less often.  Your urine turns a dark color or changes to pink, red, or brown. Document Released: 01/31/2000 Document Revised: 04/27/2011 Document Reviewed: 09/19/2007 [facility] Patient Information 2014 [place], [place].  _______________________________________________________________________

## 2019-11-17 ENCOUNTER — Ambulatory Visit: Payer: Self-pay

## 2019-11-17 ENCOUNTER — Ambulatory Visit (INDEPENDENT_AMBULATORY_CARE_PROVIDER_SITE_OTHER): Payer: No Typology Code available for payment source | Admitting: Surgical

## 2019-11-17 DIAGNOSIS — S42291A Other displaced fracture of upper end of right humerus, initial encounter for closed fracture: Secondary | ICD-10-CM | POA: Diagnosis not present

## 2019-11-17 DIAGNOSIS — M1611 Unilateral primary osteoarthritis, right hip: Secondary | ICD-10-CM

## 2019-11-19 ENCOUNTER — Encounter: Payer: Self-pay | Admitting: Surgical

## 2019-11-19 NOTE — Progress Notes (Signed)
Office Visit Note   Patient: [name]           Date of Birth: 08-Sep-1960           MRN: [id number] Visit Date: 11/17/2019 Requested by: [name], MD [address] [place],  [place] [address] PCP: [name], MD  Subjective: Chief Complaint  Patient presents with  . right shoulder soreness    HPI: [name] is a 59 y.o. male who presents to the office s/p right shoulder ORIF on 11/29/2018.  Patient is almost a year out from procedure.  He notes that he has made great improvements in regards to his right shoulder range of motion and strength but does have some concerns today.  He states the right shoulder fatigues faster when he is lifting weights though he is able to lift basically the same weight with the right shoulder as he can on the left.  Mostly he is concerned about his lifts in regards to overhead press and anterior deltoid raises.  He is left-hand dominant.  He is retired.  Aside from lifting weights the other demands he puts on the shoulder is fishing and scuba diving.  He denies any significant difficulty with raising his arm above his head.  Denies any weakness outside of the increased fatigue.  He is able to sleep well at night with no waking with pain.  He does not take any medication for his shoulder but he does take Mobic for his hip which is bothering him..                ROS: All systems reviewed are negative as they relate to the chief complaint within the history of present illness.  Patient denies fevers or chills.  Assessment & Plan: Visit Diagnoses:  1. Unilateral primary osteoarthritis, right hip   2. Other closed displaced fracture of proximal end of right humerus, initial encounter     Plan: Patient is a 59 year old male who presents s/p right shoulder ORIF on 11/29/2018.  He seems to be doing well.  He has excellent strength of the rotator cuff on exam today as well as fairly equivalent range of motion of the right shoulder compared to the left  aside from some decrease in the external rotation.  He has no significant deficits with active range of motion.  His major complaint is that he fatigues faster with lifting weights but he is able to lift about the same weight as the contralateral side.  Recommend that he keep up with exercises but he may want to avoid any very heavy overhead lifts such as with overhead press.  Patient understands and will follow up as needed.  He has total hip arthroplasty scheduled for 1 week from today with Dr. [name].  Follow-Up Instructions: No follow-ups on file.   Orders:  Orders Placed This Encounter  Procedures  . XR Shoulder Right   No orders of the defined types were placed in this encounter.     Procedures: No procedures performed   Clinical Data: No additional findings.  Objective: Vital Signs: There were no vitals taken for this visit.  Physical Exam:  Constitutional: Patient appears well-developed HEENT:  Head: Normocephalic Eyes:EOM are normal Neck: Normal range of motion Cardiovascular: Normal rate Pulmonary/chest: Effort normal Neurologic: Patient is alert Skin: Skin is warm Psychiatric: Patient has normal mood and affect  Ortho Exam: Ortho exam demonstrates right shoulder with 45 degrees external rotation, 100 degrees abduction, 170 degrees forward flexion.  This is  a compared with 75 degrees external rotation, 100 degrees abduction, 170 degrees forward flexion of the left shoulder.  No significant crepitus with passive range of motion the right shoulder.  Excellent strength of the rotator cuff with only about 15% less strength than the contralateral side.  Excellent active range of motion, easily able to lift arm above shoulder level to about 170 degrees forward flexion.  Incisions have healed well with no evidence of dehiscence or infection.  Specialty Comments:  No specialty comments available.  Imaging: No results found.   PMFS History: Patient Active Problem List     Diagnosis Date Noted  . LTBI (latent tuberculosis infection) 04/21/2017   Past Medical History:  Diagnosis Date  . Arthritis    back  . History of kidney stones   . LTBI (latent tuberculosis infection)    treated (latent)    Family History  Problem Relation Age of Onset  . Other Mother        latent TB  . Rheum arthritis Mother   . Heart failure Father   . Renal Disease Father     Past Surgical History:  Procedure Laterality Date  . COLONOSCOPY    . HEMANGIOMA EXCISION     Dr. [name]  . LAMINECTOMY AND MICRODISCECTOMY LUMBAR SPINE  1999   L5-S1  . ORIF SHOULDER FRACTURE Right 11/29/2018   Procedure: RIGHT OPEN REDUCTION INTERNAL FIXATION (ORIF) SHOULDER FRACTURE;  Surgeon: [name], MD;  Location: [facility];  Service: Orthopedics;  Laterality: Right;   Social History   Occupational History  . Not on file  Tobacco Use  . Smoking status: Current Some Day Smoker    Types: Cigars  . Smokeless tobacco: Never Used  . Tobacco comment: occ  Vaping Use  . Vaping Use: Never used  Substance and Sexual Activity  . Alcohol use: Yes    Alcohol/week: 2.0 standard drinks    Types: 2 Standard drinks or equivalent per week    Comment: occasional  . Drug use: No  . Sexual activity: Not on file

## 2019-11-20 ENCOUNTER — Other Ambulatory Visit: Payer: Self-pay

## 2019-11-20 ENCOUNTER — Encounter (HOSPITAL_COMMUNITY): Payer: Self-pay

## 2019-11-20 ENCOUNTER — Encounter (HOSPITAL_COMMUNITY)
Admission: RE | Admit: 2019-11-20 | Discharge: 2019-11-20 | Disposition: A | Discharge status: Home / Self Care | Payer: BLUE CROSS/BLUE SHIELD | Source: Ambulatory Visit | Attending: Orthopaedic Surgery | Admitting: Orthopaedic Surgery

## 2019-11-20 DIAGNOSIS — Z01812 Encounter for preprocedural laboratory examination: Secondary | ICD-10-CM | POA: Insufficient documentation

## 2019-11-20 LAB — CBC
HCT: 44.1 % (ref 39.0–52.0)
Hemoglobin: 14.7 g/dL (ref 13.0–17.0)
MCH: 29.6 pg (ref 26.0–34.0)
MCHC: 33.3 g/dL (ref 30.0–36.0)
MCV: 88.7 fL (ref 80.0–100.0)
Platelets: 216 10*3/uL (ref 150–400)
RBC: 4.97 MIL/uL (ref 4.22–5.81)
RDW: 13.2 % (ref 11.5–15.5)
WBC: 5.4 10*3/uL (ref 4.0–10.5)
nRBC: 0 % (ref 0.0–0.2)

## 2019-11-20 LAB — SURGICAL PCR SCREEN
MRSA, PCR: NEGATIVE
Staphylococcus aureus: NEGATIVE

## 2019-11-21 ENCOUNTER — Other Ambulatory Visit (HOSPITAL_COMMUNITY)
Admission: RE | Admit: 2019-11-21 | Discharge: 2019-11-21 | Disposition: A | Discharge status: Home / Self Care | Payer: BLUE CROSS/BLUE SHIELD | Source: Ambulatory Visit | Attending: Orthopaedic Surgery | Admitting: Orthopaedic Surgery

## 2019-11-21 DIAGNOSIS — Z20822 Contact with and (suspected) exposure to covid-19: Secondary | ICD-10-CM | POA: Diagnosis not present

## 2019-11-21 DIAGNOSIS — Z01812 Encounter for preprocedural laboratory examination: Secondary | ICD-10-CM | POA: Diagnosis not present

## 2019-11-21 LAB — SARS CORONAVIRUS 2 (TAT 6-24 HRS): SARS Coronavirus 2: NEGATIVE

## 2019-11-23 ENCOUNTER — Other Ambulatory Visit: Payer: Self-pay | Admitting: Orthopaedic Surgery

## 2019-11-23 MED ORDER — OXYCODONE HCL 5 MG PO TABS: 5.0000 mg | ORAL_TABLET | Freq: Four times a day (QID) | ORAL | 0 refills | Status: DC | PRN

## 2019-11-23 MED ORDER — ASPIRIN 81 MG PO CHEW: 81.0000 mg | CHEWABLE_TABLET | Freq: Two times a day (BID) | ORAL | 0 refills | Status: DC

## 2019-11-23 MED ORDER — METHOCARBAMOL 500 MG PO TABS: 500.0000 mg | ORAL_TABLET | Freq: Four times a day (QID) | ORAL | 1 refills | Status: DC | PRN

## 2019-11-24 ENCOUNTER — Ambulatory Visit (HOSPITAL_COMMUNITY): Payer: BLUE CROSS/BLUE SHIELD

## 2019-11-24 ENCOUNTER — Encounter (HOSPITAL_COMMUNITY)
Admission: RE | Disposition: A | Discharge status: Home / Self Care | Payer: Self-pay | Source: Home / Self Care | Attending: Orthopaedic Surgery

## 2019-11-24 ENCOUNTER — Ambulatory Visit (HOSPITAL_COMMUNITY): Payer: BLUE CROSS/BLUE SHIELD | Admitting: Certified Registered Nurse Anesthetist

## 2019-11-24 ENCOUNTER — Ambulatory Visit (HOSPITAL_COMMUNITY)
Admission: RE | Admit: 2019-11-24 | Discharge: 2019-11-24 | Disposition: A | Discharge status: Home / Self Care | Payer: BLUE CROSS/BLUE SHIELD | Attending: Orthopaedic Surgery | Admitting: Orthopaedic Surgery

## 2019-11-24 ENCOUNTER — Encounter (HOSPITAL_COMMUNITY): Payer: Self-pay | Admitting: Orthopaedic Surgery

## 2019-11-24 DIAGNOSIS — M1611 Unilateral primary osteoarthritis, right hip: Secondary | ICD-10-CM

## 2019-11-24 DIAGNOSIS — Z8249 Family history of ischemic heart disease and other diseases of the circulatory system: Secondary | ICD-10-CM | POA: Insufficient documentation

## 2019-11-24 DIAGNOSIS — F17290 Nicotine dependence, other tobacco product, uncomplicated: Secondary | ICD-10-CM | POA: Diagnosis not present

## 2019-11-24 DIAGNOSIS — Z419 Encounter for procedure for purposes other than remedying health state, unspecified: Secondary | ICD-10-CM

## 2019-11-24 DIAGNOSIS — I483 Typical atrial flutter: Secondary | ICD-10-CM | POA: Diagnosis not present

## 2019-11-24 DIAGNOSIS — Z96641 Presence of right artificial hip joint: Secondary | ICD-10-CM

## 2019-11-24 DIAGNOSIS — Z791 Long term (current) use of non-steroidal anti-inflammatories (NSAID): Secondary | ICD-10-CM | POA: Insufficient documentation

## 2019-11-24 DIAGNOSIS — Z7982 Long term (current) use of aspirin: Secondary | ICD-10-CM | POA: Diagnosis not present

## 2019-11-24 HISTORY — PX: TOTAL HIP ARTHROPLASTY: SHX124

## 2019-11-24 LAB — TYPE AND SCREEN
ABO/RH(D): O NEG
Antibody Screen: NEGATIVE

## 2019-11-24 LAB — ABO/RH: ABO/RH(D): O NEG

## 2019-11-24 SURGERY — ARTHROPLASTY, HIP, TOTAL, ANTERIOR APPROACH
Anesthesia: Spinal | Site: Hip | Laterality: Right

## 2019-11-24 MED ORDER — TRANEXAMIC ACID-NACL 1000-0.7 MG/100ML-% IV SOLN
1000.0000 mg | INTRAVENOUS | Status: AC
  Administered 2019-11-24: 1000 mg via INTRAVENOUS
  Filled 2019-11-24: qty 100

## 2019-11-24 MED ORDER — FENTANYL CITRATE (PF) 100 MCG/2ML IJ SOLN
INTRAMUSCULAR | Status: AC
  Filled 2019-11-24: qty 2

## 2019-11-24 MED ORDER — CEFAZOLIN SODIUM-DEXTROSE 1-4 GM/50ML-% IV SOLN
1.0000 g | Freq: Once | INTRAVENOUS | Status: AC
  Administered 2019-11-24: 1 g via INTRAVENOUS

## 2019-11-24 MED ORDER — HYDROMORPHONE HCL 1 MG/ML IJ SOLN
INTRAMUSCULAR | Status: AC
  Filled 2019-11-24: qty 1

## 2019-11-24 MED ORDER — ONDANSETRON 4 MG PO TBDP: 4.0000 mg | ORAL_TABLET | Freq: Three times a day (TID) | ORAL | 0 refills | Status: DC | PRN

## 2019-11-24 MED ORDER — ACETAMINOPHEN 325 MG PO TABS: 325.0000 mg | ORAL_TABLET | Freq: Once | ORAL | Status: DC | PRN

## 2019-11-24 MED ORDER — LACTATED RINGERS IV SOLN: INTRAVENOUS | Status: DC

## 2019-11-24 MED ORDER — ACETAMINOPHEN 325 MG PO TABS: 325.0000 mg | ORAL_TABLET | Freq: Four times a day (QID) | ORAL | Status: DC | PRN

## 2019-11-24 MED ORDER — SODIUM CHLORIDE 0.9 % IR SOLN
Status: DC | PRN
  Administered 2019-11-24: 1000 mL

## 2019-11-24 MED ORDER — AMISULPRIDE (ANTIEMETIC) 5 MG/2ML IV SOLN
10.0000 mg | Freq: Once | INTRAVENOUS | Status: AC | PRN
  Administered 2019-11-24: 10 mg via INTRAVENOUS

## 2019-11-24 MED ORDER — FENTANYL CITRATE (PF) 100 MCG/2ML IJ SOLN
INTRAMUSCULAR | Status: DC | PRN
  Administered 2019-11-24: 100 ug via INTRAVENOUS

## 2019-11-24 MED ORDER — METOCLOPRAMIDE HCL 5 MG/ML IJ SOLN: 5.0000 mg | Freq: Three times a day (TID) | INTRAMUSCULAR | Status: DC | PRN

## 2019-11-24 MED ORDER — METOCLOPRAMIDE HCL 5 MG PO TABS
5.0000 mg | ORAL_TABLET | Freq: Three times a day (TID) | ORAL | Status: DC | PRN
  Filled 2019-11-24: qty 2

## 2019-11-24 MED ORDER — ACETAMINOPHEN 10 MG/ML IV SOLN: 1000.0000 mg | Freq: Once | INTRAVENOUS | Status: DC | PRN

## 2019-11-24 MED ORDER — BUPIVACAINE IN DEXTROSE 0.75-8.25 % IT SOLN
INTRATHECAL | Status: DC | PRN
  Administered 2019-11-24: 1.8 mL via INTRATHECAL

## 2019-11-24 MED ORDER — CHLORHEXIDINE GLUCONATE 0.12 % MT SOLN
15.0000 mL | Freq: Once | OROMUCOSAL | Status: AC
  Administered 2019-11-24: 15 mL via OROMUCOSAL

## 2019-11-24 MED ORDER — PHENYLEPHRINE 40 MCG/ML (10ML) SYRINGE FOR IV PUSH (FOR BLOOD PRESSURE SUPPORT)
PREFILLED_SYRINGE | INTRAVENOUS | Status: AC
  Filled 2019-11-24: qty 10

## 2019-11-24 MED ORDER — PHENYLEPHRINE HCL (PRESSORS) 10 MG/ML IV SOLN
INTRAVENOUS | Status: DC | PRN
  Administered 2019-11-24: 40 ug via INTRAVENOUS
  Administered 2019-11-24: 80 ug via INTRAVENOUS
  Administered 2019-11-24: 40 ug via INTRAVENOUS
  Administered 2019-11-24 (×3): 80 ug via INTRAVENOUS

## 2019-11-24 MED ORDER — LACTATED RINGERS IV BOLUS
250.0000 mL | Freq: Once | INTRAVENOUS | Status: AC
  Administered 2019-11-24: 250 mL via INTRAVENOUS

## 2019-11-24 MED ORDER — ONDANSETRON HCL 4 MG/2ML IJ SOLN
INTRAMUSCULAR | Status: DC | PRN
  Administered 2019-11-24: 4 mg via INTRAVENOUS

## 2019-11-24 MED ORDER — POVIDONE-IODINE 10 % EX SWAB
2.0000 "application " | Freq: Once | CUTANEOUS | Status: AC
  Administered 2019-11-24: 2 via TOPICAL

## 2019-11-24 MED ORDER — AMISULPRIDE (ANTIEMETIC) 5 MG/2ML IV SOLN
INTRAVENOUS | Status: AC
  Filled 2019-11-24: qty 4

## 2019-11-24 MED ORDER — DILTIAZEM HCL 60 MG PO TABS: 60.0000 mg | ORAL_TABLET | Freq: Four times a day (QID) | ORAL | 11 refills | Status: DC | PRN

## 2019-11-24 MED ORDER — METHOCARBAMOL 500 MG PO TABS: 500.0000 mg | ORAL_TABLET | Freq: Four times a day (QID) | ORAL | Status: DC | PRN

## 2019-11-24 MED ORDER — MEPERIDINE HCL 50 MG/ML IJ SOLN: 6.2500 mg | INTRAMUSCULAR | Status: DC | PRN

## 2019-11-24 MED ORDER — ONDANSETRON HCL 4 MG PO TABS
4.0000 mg | ORAL_TABLET | Freq: Four times a day (QID) | ORAL | Status: DC | PRN
  Filled 2019-11-24: qty 1

## 2019-11-24 MED ORDER — HYDROMORPHONE HCL 1 MG/ML IJ SOLN: 0.5000 mg | INTRAMUSCULAR | Status: DC | PRN

## 2019-11-24 MED ORDER — MIDAZOLAM HCL 5 MG/5ML IJ SOLN
INTRAMUSCULAR | Status: DC | PRN
  Administered 2019-11-24: 2 mg via INTRAVENOUS

## 2019-11-24 MED ORDER — STERILE WATER FOR IRRIGATION IR SOLN
Status: DC | PRN
  Administered 2019-11-24: 1000 mL

## 2019-11-24 MED ORDER — ONDANSETRON HCL 4 MG/2ML IJ SOLN: 4.0000 mg | Freq: Four times a day (QID) | INTRAMUSCULAR | Status: DC | PRN

## 2019-11-24 MED ORDER — PROPOFOL 500 MG/50ML IV EMUL
INTRAVENOUS | Status: DC | PRN
  Administered 2019-11-24: 30 mg via INTRAVENOUS

## 2019-11-24 MED ORDER — CEFAZOLIN SODIUM-DEXTROSE 1-4 GM/50ML-% IV SOLN
INTRAVENOUS | Status: AC
  Filled 2019-11-24: qty 50

## 2019-11-24 MED ORDER — DEXAMETHASONE SODIUM PHOSPHATE 10 MG/ML IJ SOLN
INTRAMUSCULAR | Status: DC | PRN
  Administered 2019-11-24: 10 mg via INTRAVENOUS

## 2019-11-24 MED ORDER — LACTATED RINGERS IV BOLUS
500.0000 mL | Freq: Once | INTRAVENOUS | Status: AC
  Administered 2019-11-24: 500 mL via INTRAVENOUS

## 2019-11-24 MED ORDER — OXYCODONE HCL 5 MG PO TABS
ORAL_TABLET | ORAL | Status: AC
  Filled 2019-11-24: qty 2

## 2019-11-24 MED ORDER — PROPOFOL 500 MG/50ML IV EMUL
INTRAVENOUS | Status: DC | PRN
  Administered 2019-11-24: 75 ug/kg/min via INTRAVENOUS

## 2019-11-24 MED ORDER — ORAL CARE MOUTH RINSE: 15.0000 mL | Freq: Once | OROMUCOSAL | Status: AC

## 2019-11-24 MED ORDER — HYDROMORPHONE HCL 1 MG/ML IJ SOLN
0.2500 mg | INTRAMUSCULAR | Status: DC | PRN
  Administered 2019-11-24 (×2): 0.5 mg via INTRAVENOUS

## 2019-11-24 MED ORDER — METHOCARBAMOL 500 MG IVPB - SIMPLE MED: 500.0000 mg | Freq: Four times a day (QID) | INTRAVENOUS | Status: DC | PRN

## 2019-11-24 MED ORDER — OXYCODONE HCL 5 MG PO TABS: 10.0000 mg | ORAL_TABLET | ORAL | Status: DC | PRN

## 2019-11-24 MED ORDER — 0.9 % SODIUM CHLORIDE (POUR BTL) OPTIME
TOPICAL | Status: DC | PRN
  Administered 2019-11-24: 1000 mL

## 2019-11-24 MED ORDER — OXYCODONE HCL 5 MG PO TABS
5.0000 mg | ORAL_TABLET | ORAL | Status: DC | PRN
  Administered 2019-11-24: 10 mg via ORAL

## 2019-11-24 MED ORDER — CEFAZOLIN SODIUM-DEXTROSE 2-4 GM/100ML-% IV SOLN
2.0000 g | INTRAVENOUS | Status: AC
  Administered 2019-11-24: 2 g via INTRAVENOUS
  Filled 2019-11-24: qty 100

## 2019-11-24 MED ORDER — MIDAZOLAM HCL 2 MG/2ML IJ SOLN
INTRAMUSCULAR | Status: AC
  Filled 2019-11-24: qty 2

## 2019-11-24 MED ORDER — ACETAMINOPHEN 160 MG/5ML PO SOLN: 325.0000 mg | Freq: Once | ORAL | Status: DC | PRN

## 2019-11-24 SURGICAL SUPPLY — 42 items
APL SKNCLS STERI-STRIP NONHPOA (GAUZE/BANDAGES/DRESSINGS)
BAG SPEC THK2 15X12 ZIP CLS (MISCELLANEOUS)
BAG ZIPLOCK 12X15 (MISCELLANEOUS) IMPLANT
BENZOIN TINCTURE PRP APPL 2/3 (GAUZE/BANDAGES/DRESSINGS) IMPLANT
BLADE SAW SGTL 18X1.27X75 (BLADE) ×2 IMPLANT
BLADE SAW SGTL 18X1.27X75MM (BLADE) ×1
CLOSURE WOUND 1/2 X4 (GAUZE/BANDAGES/DRESSINGS)
COVER PERINEAL POST (MISCELLANEOUS) ×3 IMPLANT
COVER SURGICAL LIGHT HANDLE (MISCELLANEOUS) ×3 IMPLANT
COVER WAND RF STERILE (DRAPES) ×3 IMPLANT
CUP ACET PNNCL SECTR W/GRIP 56 (Hips) ×1 IMPLANT
DRAPE STERI IOBAN 125X83 (DRAPES) ×3 IMPLANT
DRAPE U-SHAPE 47X51 STRL (DRAPES) ×6 IMPLANT
DRSG AQUACEL AG ADV 3.5X10 (GAUZE/BANDAGES/DRESSINGS) ×3 IMPLANT
DURAPREP 26ML APPLICATOR (WOUND CARE) ×3 IMPLANT
ELECT REM PT RETURN 15FT ADLT (MISCELLANEOUS) ×3 IMPLANT
GAUZE XEROFORM 1X8 LF (GAUZE/BANDAGES/DRESSINGS) ×3 IMPLANT
GLOVE BIO SURGEON STRL SZ7.5 (GLOVE) ×3 IMPLANT
GLOVE BIOGEL PI IND STRL 8 (GLOVE) ×2 IMPLANT
GLOVE BIOGEL PI INDICATOR 8 (GLOVE) ×4
GLOVE ECLIPSE 8.0 STRL XLNG CF (GLOVE) ×3 IMPLANT
GOWN STRL REUS W/TWL XL LVL3 (GOWN DISPOSABLE) ×6 IMPLANT
HANDPIECE INTERPULSE COAX TIP (DISPOSABLE) ×3
HEAD CERAMIC 36 PLUS5 (Hips) ×3 IMPLANT
HOLDER FOLEY CATH W/STRAP (MISCELLANEOUS) ×3 IMPLANT
KIT TURNOVER KIT A (KITS) IMPLANT
PACK ANTERIOR HIP CUSTOM (KITS) ×3 IMPLANT
PENCIL SMOKE EVACUATOR (MISCELLANEOUS) IMPLANT
PINN SECTOR W/GRIP ACE CUP 56 (Hips) ×3 IMPLANT
PINNACLE ALTRX PLUS 4 N 36X56 (Hips) ×3 IMPLANT
SET HNDPC FAN SPRY TIP SCT (DISPOSABLE) ×1 IMPLANT
STAPLER VISISTAT 35W (STAPLE) IMPLANT
STEM FEM ACTIS HIGH SZ7 (Stem) ×3 IMPLANT
STRIP CLOSURE SKIN 1/2X4 (GAUZE/BANDAGES/DRESSINGS) IMPLANT
SUT ETHIBOND NAB CT1 #1 30IN (SUTURE) ×3 IMPLANT
SUT ETHILON 2 0 PS N (SUTURE) IMPLANT
SUT MNCRL AB 4-0 PS2 18 (SUTURE) IMPLANT
SUT VIC AB 0 CT1 36 (SUTURE) ×3 IMPLANT
SUT VIC AB 1 CT1 36 (SUTURE) ×3 IMPLANT
SUT VIC AB 2-0 CT1 27 (SUTURE) ×6
SUT VIC AB 2-0 CT1 TAPERPNT 27 (SUTURE) ×2 IMPLANT
TRAY FOLEY MTR SLVR 16FR STAT (SET/KITS/TRAYS/PACK) IMPLANT

## 2019-11-24 NOTE — Brief Op Note (Signed)
11/24/2019  8:46 AM  PATIENT:  [name]  59 y.o. male  PRE-OPERATIVE DIAGNOSIS:  osteoarthritis right hip  POST-OPERATIVE DIAGNOSIS:  osteoarthritis right hip  PROCEDURE:  Procedure(s): RIGHT TOTAL HIP ARTHROPLASTY ANTERIOR APPROACH (Right)  SURGEON:  Surgeon(s) and Role:    Mcarthur Rossetti, MD - Primary  PHYSICIAN ASSISTANT: Benita Stabile, PA-C  ANESTHESIA:   spinal  EBL:  250 mL   COUNTS:  YES  DICTATION: .Other Dictation: Dictation Number [id number]  PLAN OF CARE: Admit for overnight observation  PATIENT DISPOSITION:  PACU - hemodynamically stable.   Delay start of Pharmacological VTE agent (>24hrs) due to surgical blood loss or risk of bleeding: no

## 2019-11-24 NOTE — Progress Notes (Signed)
Physical Therapy Evaluation Patient Details Name: [name] MRN: [id number] DOB: 09/29/1960 Today's Date: 11/24/2019   History of Present Illness  Pt s/p R THR  Clinical Impression  Pt s/p R THR and presents with decreased R LE strength/ROM and post op pain limiting functional mobility.  This session, pt further limited by nausea with attempts to mobilize.  Pt should progress to dc home with family assist and HHPT follow up.    Follow Up Recommendations Home health PT    Equipment Recommendations  Rolling walker with 5" wheels    Recommendations for Other Services       Precautions / Restrictions Precautions Precautions: Fall Restrictions Weight Bearing Restrictions: No Other Position/Activity Restrictions: WBAT      Mobility  Bed Mobility Overal bed mobility: Needs Assistance Bed Mobility: Supine to Sit     Supine to sit: Min guard     General bed mobility comments: cues for sequence and min guard to manage R LE  Transfers Overall transfer level: Needs assistance Equipment used: Rolling walker (2 wheeled) Transfers: Sit to/from Stand Sit to Stand: Min guard         General transfer comment: cues for LE management and use of UEs to self assist  Ambulation/Gait Ambulation/Gait assistance: Min assist Gait Distance (Feet): 21 Feet Assistive device: Rolling walker (2 wheeled) Gait Pattern/deviations: Step-to pattern;Decreased step length - right;Decreased step length - left;Shuffle;Trunk flexed Gait velocity: decr   General Gait Details: cues for posture, position from RW and initial sequence  Stairs            Wheelchair Mobility    Modified Rankin (Stroke Patients Only)       Balance Overall balance assessment: Mild deficits observed, not formally tested                                           Pertinent Vitals/Pain Pain Assessment: 0-10 Pain Score: 5  Pain Location: R hip Pain Descriptors / Indicators:  Aching;Sore Pain Intervention(s): Limited activity within patient's tolerance;Monitored during session;Premedicated before session;Ice applied    Home Living Family/patient expects to be discharged to:: Private residence Living Arrangements: Spouse/significant other Available Help at Discharge: Family Type of Home: House Home Access: Stairs to enter   Technical brewer of Steps: 1 Home Layout: One level Home Equipment: [place] - single point      Prior Function Level of Independence: Independent               Hand Dominance        Extremity/Trunk Assessment   Upper Extremity Assessment Upper Extremity Assessment: Overall WFL for tasks assessed    Lower Extremity Assessment Lower Extremity Assessment: RLE deficits/detail RLE Deficits / Details: 2/5 strength at hip with AAROM at hip to 85 flex and 15 abd    Cervical / Trunk Assessment Cervical / Trunk Assessment: Normal  Communication   Communication: No difficulties  Cognition Arousal/Alertness: Awake/alert Behavior During Therapy: WFL for tasks assessed/performed Overall Cognitive Status: Within Functional Limits for tasks assessed                                        General Comments      Exercises Total Joint Exercises Ankle Circles/Pumps: AROM;Both;15 reps;Supine Quad Sets: AROM;Both;10 reps;Supine Heel Slides: AAROM;Right;15 reps;Supine Hip  ABduction/ADduction: AAROM;Right;10 reps;Supine   Assessment/Plan    PT Assessment Patient needs continued PT services  PT Problem List Decreased strength;Decreased range of motion;Decreased activity tolerance;Decreased mobility;Decreased balance;Decreased knowledge of use of DME;Pain       PT Treatment Interventions DME instruction;Gait training;Stair training;Functional mobility training;Therapeutic activities;Therapeutic exercise;Patient/family education    PT Goals (Current goals can be found in the Care Plan section)  Acute Rehab PT  Goals Patient Stated Goal: Regain IND PT Goal Formulation: With patient Time For Goal Achievement: 12/01/19 Potential to Achieve Goals: Good    Frequency 7X/week   Barriers to discharge        Co-evaluation               AM-PAC PT "6 Clicks" Mobility  Outcome Measure Help needed turning from your back to your side while in a flat bed without using bedrails?: A Little Help needed moving from lying on your back to sitting on the side of a flat bed without using bedrails?: A Little Help needed moving to and from a bed to a chair (including a wheelchair)?: A Little Help needed standing up from a chair using your arms (e.g., wheelchair or bedside chair)?: A Little Help needed to walk in hospital room?: A Little Help needed climbing 3-5 steps with a railing? : A Little 6 Click Score: 18    End of Session Equipment Utilized During Treatment: Gait belt Activity Tolerance: Other (comment) (nausea) Patient left: in chair;with call bell/phone within reach Nurse Communication: Mobility status PT Visit Diagnosis: Difficulty in walking, not elsewhere classified (R26.2)    Time: 6644-0347 PT Time Calculation (min) (ACUTE ONLY): 36 min   Charges:   PT Evaluation $PT Eval Low Complexity: 1 Low PT Treatments $Therapeutic Exercise: 8-22 mins        Debe Coder PT Acute Rehabilitation Services Pager [phone] Office [phone]   Sher Hellinger 11/24/2019, 2:38 PM

## 2019-11-24 NOTE — Progress Notes (Signed)
Physical Therapy Treatment Patient Details Name: [name] MRN: [id number] DOB: Jul 10, 1960 Today's Date: 11/24/2019    History of Present Illness Pt s/p R THR    PT Comments    Pt up to ambulate increased distance in hall with increased stability and no c/o nausea.  Pt reviewed car transfers verbally and provided with HEP handout.  Pt eager for dc home.  Follow Up Recommendations  Home health PT     Equipment Recommendations  Rolling walker with 5" wheels    Recommendations for Other Services       Precautions / Restrictions Precautions Precautions: Fall Restrictions Weight Bearing Restrictions: No Other Position/Activity Restrictions: WBAT    Mobility  Bed Mobility Overal bed mobility: Needs Assistance Bed Mobility: Supine to Sit     Supine to sit: Min guard     General bed mobility comments: cues for sequence and min guard to manage R LE  Transfers Overall transfer level: Needs assistance Equipment used: Rolling walker (2 wheeled) Transfers: Sit to/from Stand Sit to Stand: Supervision         General transfer comment: cues for LE management and use of UEs to self assist  Ambulation/Gait Ambulation/Gait assistance: Min guard;Supervision Gait Distance (Feet): 140 Feet Assistive device: Rolling walker (2 wheeled) Gait Pattern/deviations: Step-to pattern;Decreased step length - right;Decreased step length - left;Shuffle;Trunk flexed Gait velocity: decr   General Gait Details: cues for posture, position from RW and initial sequence   Stairs Stairs: Yes Stairs assistance: Min assist Stair Management: No rails;Step to pattern;Forwards;With walker Number of Stairs: 2 General stair comments: single step twice with cues for sequence and foot/RW placement   Wheelchair Mobility    Modified Rankin (Stroke Patients Only)       Balance Overall balance assessment: Mild deficits observed, not formally tested                                           Cognition Arousal/Alertness: Awake/alert Behavior During Therapy: WFL for tasks assessed/performed Overall Cognitive Status: Within Functional Limits for tasks assessed                                        Exercises Total Joint Exercises Ankle Circles/Pumps: AROM;Both;15 reps;Supine Quad Sets: AROM;Both;10 reps;Supine Heel Slides: AAROM;Right;15 reps;Supine Hip ABduction/ADduction: AAROM;Right;10 reps;Supine    General Comments        Pertinent Vitals/Pain Pain Assessment: 0-10 Pain Score: 5  Pain Location: R hip Pain Descriptors / Indicators: Aching;Sore Pain Intervention(s): Limited activity within patient's tolerance;Monitored during session;Premedicated before session;Ice applied    Home Living Family/patient expects to be discharged to:: Private residence Living Arrangements: Spouse/significant other Available Help at Discharge: Family Type of Home: House Home Access: Stairs to enter   Home Layout: One level Home Equipment: [employer] - single point      Prior Function Level of Independence: Independent          PT Goals (current goals can now be found in the care plan section) Acute Rehab PT Goals Patient Stated Goal: Regain IND PT Goal Formulation: With patient Time For Goal Achievement: 12/01/19 Potential to Achieve Goals: Good Progress towards PT goals: Progressing toward goals    Frequency    7X/week      PT Plan Current plan remains appropriate  Co-evaluation              AM-PAC PT "6 Clicks" Mobility   Outcome Measure  Help needed turning from your back to your side while in a flat bed without using bedrails?: A Little Help needed moving from lying on your back to sitting on the side of a flat bed without using bedrails?: A Little Help needed moving to and from a bed to a chair (including a wheelchair)?: A Little Help needed standing up from a chair using your arms (e.g., wheelchair or bedside  chair)?: A Little Help needed to walk in hospital room?: A Little Help needed climbing 3-5 steps with a railing? : A Little 6 Click Score: 18    End of Session Equipment Utilized During Treatment: Gait belt Activity Tolerance: Other (comment) Patient left: in chair;with call bell/phone within reach Nurse Communication: Mobility status PT Visit Diagnosis: Difficulty in walking, not elsewhere classified (R26.2)     Time: 9323-5573 PT Time Calculation (min) (ACUTE ONLY): 21 min  Charges:  $Gait Training: 8-22 mins $Therapeutic Exercise: 8-22 mins                     Debe Coder PT Acute Rehabilitation Services Pager [phone] Office [phone]    Zaydah Nawabi 11/24/2019, 2:46 PM

## 2019-11-24 NOTE — Op Note (Signed)
NAME: [name], [name] MEDICAL RECORD [id number] ACCOUNT [id number] DATE OF BIRTH:[birth year] FACILITY: [facility] LOCATION: [facility] PHYSICIAN:Tryson Lumley Kerry Fort, MD  OPERATIVE REPORT  DATE OF PROCEDURE:  11/24/2019  PREOPERATIVE DIAGNOSIS:  Primary osteoarthritis and degenerative joint disease, right hip.  POSTOPERATIVE DIAGNOSIS:  Primary osteoarthritis and degenerative joint disease, right hip.  PROCEDURE:  Right total hip arthroplasty through direct anterior approach.  IMPLANTS:  [employer] Gription acetabular component size 56, size 36+4 neutral polyethylene liner, size 7 Actis femoral component with a high offset, size 36+5 ceramic hip ball.  SURGEON:  Lind Guest. [name], MD  ASSISTANT:  Erskine Emery, PA-C  ANESTHESIA:  Spinal.  ESTIMATED BLOOD LOSS:  250 mL.  ANTIBIOTICS:  Two grams IV Ancef.  COMPLICATIONS:  None.  INDICATIONS:  The patient is a 59 year old gentleman with debilitating arthritis involving his right hip.  I have seen him for 3 years now.  He has been well documented with clinical exam and x-rays.  At this point, he has tried and failed all forms of  conservative treatment including steroid injection of the hip.  He now walks with a limp and has daily right hip pain.  At this point, he does wish to proceed with a total hip arthroplasty.  We described in detail the risks and benefits of the surgery.   We talked about the risk of acute blood loss anemia, nerve or vessel injury, fracture, infection, dislocation, DVT and implant failure and skin and soft tissue issues.  We talked about the goals being decreased pain, improve mobility and overall improve  quality of life.  DESCRIPTION OF PROCEDURE:  After informed consent was obtained and appropriate right hip was marked he was brought to the operating room and sat up on a stretcher.  Spinal anesthesia was then obtained.  He was laid in supine position on a stretcher.  I  assessed his leg lengths and  found them to be slightly short on the right operative side.  Traction boots were placed on both his feet.  He was placed supine on the Hana fracture table with the perineal post in place and both legs in line skeletal  traction device and no traction applied.  He was noted by anesthesia that he was having some atrial flutter, but no complicating issues and so they felt it was okay to proceed with the case to have this assessed in the PACU postoperatively.  Of note, the  case do go on uneventfully.  After he was prepped and draped with DuraPrep and sterile drapes a time-out was called and he was identified as correct patient, correct right hip.  We then made an incision just inferior and posterior to the anterior superior iliac spine and carried  this obliquely down the leg.  We dissected down tensor fascia lata muscle.  Tensor fascia was then divided longitudinally to proceed with direct anterior approach to the hip.  We identified and cauterized circumflex vessels and identified the hip  capsule, opened the hip capsule in an L-type format finding a moderate joint effusion and significant periarticular osteophytes around the femoral head and neck.  I placed Cobra retractors within the medial and lateral joint capsule around the femoral  neck and made our oscillating saw just proximal to the lesser trochanter and completed this with an osteotome.  We placed a corkscrew guide in the femoral head and removed the femoral head in its entirety and found a wide area devoid of cartilage.  I  then placed a bent Hohmann over  the medial acetabular rim and removed remnants of the acetabular labrum and other debris.  We then began broaching using the ACTIS broaching system from a size 0 going up to a size 7.  With a size 7 in place, we trialed a  standard offset femoral neck and a 36+1.5 hip ball, reduced this in the acetabulum and he definitely needed more offset and leg length.  We dislocated the hip and removed the  trial components.  We placed the real Actis femoral component size 7 but we  went with the high offset stem to tighten him up and we went with a 36+5 ceramic hip ball, reduced this in the acetabulum and I was pleased with leg length, offset, range of motion and stability assessed both mechanically and radiographically.  We then  irrigated the soft tissue with normal saline solution using pulsatile lavage.  We were able to reapproximate the joint capsule with interrupted #1 Ethibond suture.  The tensor fascia was closed with #1 Vicryl followed by 0 Vicryl to close deep tissue and  2-0 Vicryl to close the subcutaneous tissue.  The skin was closed with staples.  Xeroform and Aquacel dressing was applied.  He was taken off the Hana table and taken to recovery room in stable condition.  Of note, Benita Stabile, PA-C, assisted in the  entire case.  His assistance was crucial throughout all aspects of this case.  Postoperatively, he will be monitored for the atrial flutter and we will have further consultation as needed and potentially admission overnight.  CN/NUANCE  D:11/24/2019 T:11/24/2019 JOB:[id number]

## 2019-11-24 NOTE — Anesthesia Preprocedure Evaluation (Signed)
Anesthesia Evaluation  Patient identified by MRN, date of birth, ID band Patient awake    Reviewed: Allergy & Precautions, NPO status , Patient's Chart, lab work & pertinent test results  Airway Mallampati: I  TM Distance: >3 FB Neck ROM: Full    Dental  (+) Teeth Intact, Dental Advisory Given   Pulmonary Current Smoker and Patient abstained from smoking.,    breath sounds clear to auscultation       Cardiovascular negative cardio ROS   Rhythm:Regular Rate:Normal     Neuro/Psych negative neurological ROS  negative psych ROS   GI/Hepatic negative GI ROS, Neg liver ROS,   Endo/Other  negative endocrine ROS  Renal/GU negative Renal ROS     Musculoskeletal  (+) Arthritis ,   Abdominal Normal abdominal exam  (+) - obese,   Peds  Hematology negative hematology ROS (+)   Anesthesia Other Findings   Reproductive/Obstetrics                            Anesthesia Physical Anesthesia Plan  ASA: II  Anesthesia Plan: Spinal   Post-op Pain Management:    Induction: Intravenous  PONV Risk Score and Plan: Propofol infusion and Ondansetron  Airway Management Planned: Natural Airway and Simple Face Mask  Additional Equipment: None  Intra-op Plan:   Post-operative Plan:   Informed Consent: I have reviewed the patients History and Physical, chart, labs and discussed the procedure including the risks, benefits and alternatives for the proposed anesthesia with the patient or authorized representative who has indicated his/her understanding and acceptance.       Plan Discussed with: CRNA  Anesthesia Plan Comments: (Lab Results      Component                Value               Date                      WBC                      5.4                 11/20/2019                HGB                      14.7                11/20/2019                HCT                      44.1                 11/20/2019                MCV                      88.7                11/20/2019                PLT                      216  11/20/2019           )       Anesthesia Quick Evaluation

## 2019-11-24 NOTE — Discharge Instructions (Signed)

## 2019-11-24 NOTE — Consult Note (Signed)
Cardiology Consultation:   Patient ID: [name] MRN: [id number]; DOB: [birth year]  Admit date: 11/24/2019 Date of Consult: 11/24/2019  Primary Care Provider: [name], MD [facility] Cardiologist: No primary care provider on file.  [facility] Electrophysiologist:  None    Patient Profile:   [name] is a 59 y.o. male  who is being seen today for the evaluation of postop atrial flutter typical at the request of Dr. Jean Rosenthal.  History of Present Illness:   Mr. Gluth is a 59 year old male with severe arthritis who underwent right hip replacement today by Dr. Jean Rosenthal.  Perioperatively developed typical atrial flutter.  EKG postop immediate shows heart rate of 60 bpm with 4-1 typical atrial flutter conduction.  Overall he is feeling well.  Asymptomatic.  Able to ambulate without rapid ventricular response.  He is going to be on aspirin.  Denies hypertension, diabetes, prior stroke, prior coronary artery disease.  His father did have heart failure at a later age.  Occasionally will smoke a cigar or pipe.  No stimulants.   Past Medical History:  Diagnosis Date  . Arthritis    back  . History of kidney stones   . 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 COLONOSCOPY    . HEMANGIOMA EXCISION     Dr. [name]  . LAMINECTOMY AND MICRODISCECTOMY LUMBAR SPINE  1999   L5-S1  . LITHOTRIPSY    . ORIF SHOULDER FRACTURE Right 11/29/2018   Procedure: RIGHT OPEN REDUCTION INTERNAL FIXATION (ORIF) SHOULDER FRACTURE;  Surgeon: [name], MD;  Location: [place];  Service: Orthopedics;  Laterality: Right;     Home Medications:  Prior to Admission medications   Medication Sig Start Date End Date Taking? Authorizing Provider  aspirin (ASPIRIN CHILDRENS) 81 MG chewable tablet Chew 1 tablet (81 mg total) by mouth 2 (two)  times daily after a meal. 11/23/19  Yes Mcarthur Rossetti, MD  loratadine (CLARITIN) 10 MG tablet Take 10 mg by mouth daily.   Yes [provider]  meloxicam (MOBIC) 15 MG tablet Take 15 mg by mouth daily.    Yes [provider]  psyllium (METAMUCIL) 28 % packet Take 1 packet by mouth 2 (two) times daily.   Yes [provider]  triamcinolone (NASACORT ALLERGY 24HR) 55 MCG/ACT AERO nasal inhaler Place 2 sprays into the nose daily as needed (allergies).   Yes [provider]  zolpidem (AMBIEN) 10 MG tablet Take 10 mg by mouth at bedtime as needed for sleep.   Yes [provider]  methocarbamol (ROBAXIN) 500 MG tablet Take 1 tablet (500 mg total) by mouth every 6 (six) hours as needed. 11/23/19   Mcarthur Rossetti, MD  ondansetron (ZOFRAN ODT) 4 MG disintegrating tablet Take 1 tablet (4 mg total) by mouth every 8 (eight) hours as needed for nausea or vomiting. 11/24/19   Mcarthur Rossetti, MD  oxyCODONE (ROXICODONE) 5 MG immediate release tablet Take 1-2 tablets (5-10 mg total) by mouth every 6 (six) hours as needed for severe pain. 11/23/19   Mcarthur Rossetti, MD    Inpatient Medications: Scheduled Meds: . amisulpride      . HYDROmorphone      . oxyCODONE       Continuous Infusions: . acetaminophen    . ceFAZolin    . lactated ringers 10 mL/hr at 11/24/19 0713  .  lactated ringers    . methocarbamol (ROBAXIN) IV     PRN Meds: 0.9 % irrigation (POUR BTL), acetaminophen, acetaminophen **OR** acetaminophen (TYLENOL) oral liquid 160 mg/5 mL, [START ON 11/25/2019] acetaminophen, HYDROmorphone (DILAUDID) injection, HYDROmorphone (DILAUDID) injection, meperidine (DEMEROL) injection, methocarbamol **OR** methocarbamol (ROBAXIN) IV, metoCLOPramide **OR** metoCLOPramide (REGLAN) injection, ondansetron **OR** ondansetron (ZOFRAN) IV, oxyCODONE, oxyCODONE, sodium chloride irrigation, sterile water for irrigation  Allergies:   No Known  Allergies  Social History:   Social History   Socioeconomic History  . Marital status: Married    Spouse name: Not on file  . Number of children: Not on file  . Years of education: Not on file  . Highest education level: Not on file  Occupational History  . Not on file  Tobacco Use  . Smoking status: Current Some Day Smoker    Types: Cigars  . Smokeless tobacco: Never Used  . Tobacco comment: occ  Vaping Use  . Vaping Use: Never used  Substance and Sexual Activity  . Alcohol use: Yes    Alcohol/week: 2.0 standard drinks    Types: 2 Standard drinks or equivalent per week    Comment: occasional  . Drug use: No  . Sexual activity: Not on file  Other Topics Concern  . Not on file  Social History Narrative  . Not on file   Social Determinants of Health   Financial Resource Strain:   . Difficulty of Paying Living Expenses: Not on file  Food Insecurity:   . Worried About Charity fundraiser in the Last Year: Not on file  . Ran Out of Food in the Last Year: Not on file  Transportation Needs:   . Lack of Transportation (Medical): Not on file  . Lack of Transportation (Non-Medical): Not on file  Physical Activity:   . Days of Exercise per Week: Not on file  . Minutes of Exercise per Session: Not on file  Stress:   . Feeling of Stress : Not on file  Social Connections:   . Frequency of Communication with Friends and Family: Not on file  . Frequency of Social Gatherings with Friends and Family: Not on file  . Attends Religious Services: Not on file  . Active Member of Clubs or Organizations: Not on file  . Attends Archivist Meetings: Not on file  . Marital Status: Not on file  Intimate Partner Violence:   . Fear of Current or Ex-Partner: Not on file  . Emotionally Abused: Not on file  . Physically Abused: Not on file  . Sexually Abused: Not on file    Family History:    Family History  Problem Relation Age of Onset  . Other Mother        latent TB  .  Rheum arthritis Mother   . Heart failure Father   . Renal Disease Father      ROS:  Please see the history of present illness.  Denies any fevers chills nausea vomiting syncope bleeding. All other ROS reviewed and negative.     Physical Exam/Data:   Vitals:   11/24/19 1045 11/24/19 1100 11/24/19 1345 11/24/19 1400  BP: (!) 157/96 136/90 (!) 170/83 (!) 160/79  Pulse: 61 62 95 93  Resp: 14 14 15    Temp:  (!) 97.5 F (36.4 C) 97.8 F (36.6 C)   TempSrc:      SpO2: 100% 100% 98%   Weight:      Height:        Intake/Output  Summary (Last 24 hours) at 11/24/2019 1518 Last data filed at 11/24/2019 1015 Gross per 24 hour  Intake 1200 ml  Output 1350 ml  Net -150 ml   Last 3 Weights 11/24/2019 11/20/2019 11/13/2019  Weight (lbs) 229 lb 11.2 oz 229 lb 11.2 oz 220 lb  Weight (kg) 104.191 kg 104.191 kg 99.791 kg     Body mass index is 32.96 kg/m.  General:  Well nourished, well developed, in no acute distress HEENT: normal Lymph: no adenopathy Neck: no JVD Endocrine:  No thryomegaly Vascular: No carotid bruits; FA pulses 2+ bilaterally without bruits  Cardiac:  normal S1, S2; RRR; no murmur occasional irregularity Lungs:  clear to auscultation bilaterally, no wheezing, rhonchi or rales  Abd: soft, nontender, no hepatomegaly  Ext: no edema Musculoskeletal:  No deformities, BUE and BLE strength normal and equal Skin: warm and dry  Neuro:  CNs 2-12 intact, no focal abnormalities noted Psych:  Normal affect   EKG:  The EKG was personally reviewed and demonstrates: Typical atrial flutter heart rate 60  Laboratory Data:  High Sensitivity Troponin:  No results for input(s): TROPONINIHS in the last 720 hours.   ChemistryNo results for input(s): NA, K, CL, CO2, GLUCOSE, BUN, CREATININE, CALCIUM, GFRNONAA, GFRAA, ANIONGAP in the last 168 hours.  No results for input(s): PROT, ALBUMIN, AST, ALT, ALKPHOS, BILITOT in the last 168 hours. Hematology Recent Labs  Lab 11/20/19 0838  WBC  5.4  RBC 4.97  HGB 14.7  HCT 44.1  MCV 88.7  MCH 29.6  MCHC 33.3  RDW 13.2  PLT 216   BNPNo results for input(s): BNP, PROBNP in the last 168 hours.  DDimer No results for input(s): DDIMER in the last 168 hours.   Radiology/Studies:  DG Pelvis Portable  Result Date: 11/24/2019 CLINICAL DATA:  Status post right hip replacement. EXAM: PORTABLE PELVIS 1-2 VIEWS COMPARISON:  October 18, 2019. FINDINGS: The right femoral and acetabular components appear to be well situated. Expected postoperative changes are noted in the surrounding soft tissues. IMPRESSION: Status post right total hip arthroplasty. Electronically Signed   By: Marijo Conception M.D.   On: 11/24/2019 09:31   DG C-Arm 1-60 Min-No Report  Result Date: 11/24/2019 Fluoroscopy was utilized by the requesting physician.  No radiographic interpretation.   DG HIP OPERATIVE UNILAT W OR W/O PELVIS RIGHT  Result Date: 11/24/2019 CLINICAL DATA:  RIGHT hip replacement EXAM: OPERATIVE RIGHT HIP (WITH PELVIS IF PERFORMED) 6 VIEWS TECHNIQUE: Fluoroscopic spot image(s) were submitted for interpretation post-operatively. COMPARISON:  10/18/2019 FLUOROSCOPY TIME:  Not provided; please refer to operative records FINDINGS: Osseous demineralization. Advanced osteoarthritic changes RIGHT hip. Images demonstrate placement of a RIGHT hip prosthesis, which appears in expected position. No fracture, dislocation, or bone destruction. IMPRESSION: RIGHT hip prosthesis without acute abnormalities. Electronically Signed   By: Lavonia Dana M.D.   On: 11/24/2019 08:49     Assessment and Plan:   59 year old with right hip replacement, new onset atrial flutter with rate control.  Atrial flutter -Currently controlled heart rate.  Rate ranging between 60 and 95. EKG shows 4-1 conduction heart rate 60 with negative deflection in inferior leads compatible with typical atrial flutter. -Hopefully over the next 24 to 48 hours he will auto convert back to sinus  rhythm. -Given his current normal heart rate, I do not think he needs any rate control medications. -We will set him up for close outpatient follow-up, obtain an echocardiogram to ensure proper structure and function of his heart. -I  will go ahead and prescribe him diltiazem 60 mg short acting p.o. every 4 hours as needed in case he has breakthrough tachycardia at home. -Unable to anticoagulate with Eliquis since he is immediately postop.  Bleeding risks outweigh the benefit.  Also, chads vasc score is currently 0. -If he does remain in flutter however several days postop, we could consider trying to convert him after loading him with Eliquis.  Right hip replacement -Continue with current postop care.  During ambulation today he did not have any rapid ventricular response.  He is on aspirin.    For questions or updates, please contact [place] Please consult [email or web address] for contact info under    Signed, Candee Furbish, MD  11/24/2019 3:18 PM

## 2019-11-24 NOTE — Anesthesia Procedure Notes (Signed)
Spinal  Start time: 11/24/2019 7:18 AM End time: 11/24/2019 7:20 AM Staffing Performed: anesthesiologist  Anesthesiologist: Effie Berkshire, MD Preanesthetic Checklist Completed: patient identified, IV checked, site marked, risks and benefits discussed, surgical consent, monitors and equipment checked, pre-op evaluation and timeout performed Spinal Block Patient position: sitting Prep: DuraPrep and site prepped and draped Location: L3-4 Injection technique: single-shot Needle Needle type: Pencan  Needle gauge: 24 G Needle length: 10 cm Needle insertion depth: 10 cm Additional Notes Patient tolerated well. No immediate complications.

## 2019-11-24 NOTE — Transfer of Care (Signed)
Immediate Anesthesia Transfer of Care Note  Patient: [name]  Procedure(s) Performed: RIGHT TOTAL HIP ARTHROPLASTY ANTERIOR APPROACH (Right Hip)  Patient Location: PACU  Anesthesia Type:Spinal  Level of Consciousness: sedated, patient cooperative and responds to stimulation  Airway & Oxygen Therapy: Patient Spontanous Breathing and Patient connected to face mask oxygen  Post-op Assessment: Report given to RN and Post -op Vital signs reviewed and stable  Post vital signs: Reviewed and stable  Last Vitals:  Vitals Value Taken Time  BP    Temp    Pulse 61 11/24/19 0847  Resp 25 11/24/19 0847  SpO2 100 % 11/24/19 0847  Vitals shown include unvalidated device data.  Last Pain:  Vitals:   11/24/19 0553  TempSrc:   PainSc: 0-No pain         Complications: No complications documented.

## 2019-11-24 NOTE — H&P (Signed)
TOTAL HIP ADMISSION H&P  Patient is admitted for right total hip arthroplasty.  Subjective:  Chief Complaint: right hip pain  HPI: [name], 59 y.o. male, has a history of pain and functional disability in the right hip(s) due to arthritis and patient has failed non-surgical conservative treatments for greater than 12 weeks to include NSAID's and/or analgesics, corticosteriod injections, flexibility and strengthening excercises and activity modification.  Onset of symptoms was gradual starting 3 years ago with gradually worsening course since that time.The patient noted no past surgery on the right hip(s).  Patient currently rates pain in the right hip at 10 out of 10 with activity. Patient has night pain, worsening of pain with activity and weight bearing, pain that interfers with activities of daily living and pain with passive range of motion. Patient has evidence of subchondral sclerosis, periarticular osteophytes and joint space narrowing by imaging studies. This condition presents safety issues increasing the risk of falls.  There is no current active infection.  Patient Active Problem List   Diagnosis Date Noted   Unilateral primary osteoarthritis, right hip 11/24/2019   LTBI (latent tuberculosis infection) 04/21/2017   Past Medical History:  Diagnosis Date   Arthritis    back   History of kidney stones    LTBI (latent tuberculosis infection)    treated (latent) with rifampin, tested positive 30 years ago    Past Surgical History:  Procedure Laterality Date   COLONOSCOPY     HEMANGIOMA EXCISION     Dr. [name]   LAMINECTOMY AND MICRODISCECTOMY LUMBAR SPINE  1999   L5-S1   LITHOTRIPSY     ORIF SHOULDER FRACTURE Right 11/29/2018   Procedure: RIGHT OPEN REDUCTION INTERNAL FIXATION (ORIF) SHOULDER FRACTURE;  Surgeon: [name], MD;  Location: [place];  Service: Orthopedics;  Laterality: Right;    Current Facility-Administered Medications  Medication Dose  Route Frequency Provider Last Rate Last Admin   ceFAZolin (ANCEF) IVPB 2g/100 mL premix  2 g Intravenous On Call to OR Pete Pelt, PA-C       lactated ringers infusion   Intravenous Continuous Lyn Hollingshead, MD 10 mL/hr at 11/24/19 0602 New Bag at 11/24/19 0602   tranexamic acid (CYKLOKAPRON) IVPB 1,000 mg  1,000 mg Intravenous To OR Pete Pelt, PA-C       No Known Allergies  Social History   Tobacco Use   Smoking status: Current Some Day Smoker    Types: Cigars   Smokeless tobacco: Never Used   Tobacco comment: occ  Substance Use Topics   Alcohol use: Yes    Alcohol/week: 2.0 standard drinks    Types: 2 Standard drinks or equivalent per week    Comment: occasional    Family History  Problem Relation Age of Onset   Other Mother        latent TB   Rheum arthritis Mother    Heart failure Father    Renal Disease Father      Review of Systems  All other systems reviewed and are negative.   Objective:  Physical Exam Vitals reviewed.  Constitutional:      Appearance: Normal appearance.  HENT:     Head: Normocephalic and atraumatic.  Eyes:     Extraocular Movements: Extraocular movements intact.     Pupils: Pupils are equal, round, and reactive to light.  Cardiovascular:     Rate and Rhythm: Normal rate and regular rhythm.     Pulses: Normal pulses.  Pulmonary:  Effort: Pulmonary effort is normal.     Breath sounds: Normal breath sounds.  Abdominal:     General: Bowel sounds are normal.     Palpations: Abdomen is soft.  Musculoskeletal:     Right hip: Tenderness and bony tenderness present. Decreased range of motion. Decreased strength.  Neurological:     Mental Status: He is alert and oriented to person, place, and time.  Psychiatric:        Behavior: Behavior normal.     Vital signs in last 24 hours: Temp:  [98.1 F (36.7 C)] 98.1 F (36.7 C) (10/08 0533) Pulse Rate:  [67] 67 (10/08 0533) Resp:  [16] 16 (10/08 0533) BP:  (185)/(88) 185/88 (10/08 0533) SpO2:  [99 %] 99 % (10/08 0533) Weight:  [104.2 kg] 104.2 kg (10/08 0541)  Labs:   Estimated body mass index is 32.96 kg/m as calculated from the following:   Height as of this encounter: 5\' 10"  (1.778 m).   Weight as of this encounter: 104.2 kg.   Imaging Review Plain radiographs demonstrate severe degenerative joint disease of the right hip(s). The bone quality appears to be excellent for age and reported activity level.      Assessment/Plan:  End stage arthritis, right hip(s)  The patient history, physical examination, clinical judgement of the provider and imaging studies are consistent with end stage degenerative joint disease of the right hip(s) and total hip arthroplasty is deemed medically necessary. The treatment options including medical management, injection therapy, arthroscopy and arthroplasty were discussed at length. The risks and benefits of total hip arthroplasty were presented and reviewed. The risks due to aseptic loosening, infection, stiffness, dislocation/subluxation,  thromboembolic complications and other imponderables were discussed.  The patient acknowledged the explanation, agreed to proceed with the plan and consent was signed. Patient is being admitted for inpatient treatment for surgery, pain control, PT, OT, prophylactic antibiotics, VTE prophylaxis, progressive ambulation and ADL's and discharge planning.The patient is planning to be discharged home with home health services

## 2019-11-24 NOTE — Anesthesia Postprocedure Evaluation (Signed)
Anesthesia Post Note  Patient: [name]  Procedure(s) Performed: RIGHT TOTAL HIP ARTHROPLASTY ANTERIOR APPROACH (Right Hip)     Patient location during evaluation: PACU Anesthesia Type: Spinal Level of consciousness: oriented and awake and alert Pain management: pain level controlled Vital Signs Assessment: post-procedure vital signs reviewed and stable Respiratory status: spontaneous breathing, respiratory function stable and patient connected to nasal cannula oxygen Cardiovascular status: blood pressure returned to baseline and stable Postop Assessment: no headache, no backache and no apparent nausea or vomiting Anesthetic complications: no Comments: Atrial flutter noted intraop, confirmed in PACU with 12 lead EKG. Contacted Trish w/ Cardiology. She will contact patient and set up appointment for Monday.    No complications documented.  Last Vitals:  Vitals:   11/24/19 1045 11/24/19 1100  BP: (!) 157/96 136/90  Pulse: 61 62  Resp: 14 14  Temp:  (!) 36.4 C  SpO2: 100% 100%    Last Pain:  Vitals:   11/24/19 1100  TempSrc:   PainSc: [place]

## 2019-11-24 NOTE — H&P (View-Only) (Signed)
Cardiology Consultation:   Patient ID: [name]: [id number]; DOB: May 03, 1960  Admit date: 11/24/2019 Date of Consult: 11/24/2019  Primary Care Provider: [name], MD [facility] Cardiologist: No primary care provider on file.  [facility] Electrophysiologist:  None    Patient Profile:   [name] is a 59 y.o. male  who is being seen today for the evaluation of postop atrial flutter typical at the request of Dr. Jean Rosenthal.  History of Present Illness:   [name] is a 59 year old male with severe arthritis who underwent right hip replacement today by Dr. Jean Rosenthal.  Perioperatively developed typical atrial flutter.  EKG postop immediate shows heart rate of 60 bpm with 4-1 typical atrial flutter conduction.  Overall he is feeling well.  Asymptomatic.  Able to ambulate without rapid ventricular response.  He is going to be on aspirin.  Denies hypertension, diabetes, prior stroke, prior coronary artery disease.  His father did have heart failure at a later age.  Occasionally will smoke a cigar or pipe.  No stimulants.   Past Medical History:  Diagnosis Date  . Arthritis    back  . History of kidney stones   . 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 COLONOSCOPY    . HEMANGIOMA EXCISION     Dr. [name]  . LAMINECTOMY AND MICRODISCECTOMY LUMBAR SPINE  1999   L5-S1  . LITHOTRIPSY    . ORIF SHOULDER FRACTURE Right 11/29/2018   Procedure: RIGHT OPEN REDUCTION INTERNAL FIXATION (ORIF) SHOULDER FRACTURE;  Surgeon: [name], MD;  Location: [facility];  Service: Orthopedics;  Laterality: Right;     Home Medications:  Prior to Admission medications   Medication Sig Start Date End Date Taking? Authorizing Provider  aspirin (ASPIRIN CHILDRENS) 81 MG chewable tablet Chew 1 tablet (81 mg total) by mouth 2 (two)  times daily after a meal. 11/23/19  Yes Mcarthur Rossetti, MD  loratadine (CLARITIN) 10 MG tablet Take 10 mg by mouth daily.   Yes [provider]  meloxicam (MOBIC) 15 MG tablet Take 15 mg by mouth daily.    Yes [provider]  psyllium (METAMUCIL) 28 % packet Take 1 packet by mouth 2 (two) times daily.   Yes [provider]  triamcinolone (NASACORT ALLERGY 24HR) 55 MCG/ACT AERO nasal inhaler Place 2 sprays into the nose daily as needed (allergies).   Yes [provider]  zolpidem (AMBIEN) 10 MG tablet Take 10 mg by mouth at bedtime as needed for sleep.   Yes [provider]  methocarbamol (ROBAXIN) 500 MG tablet Take 1 tablet (500 mg total) by mouth every 6 (six) hours as needed. 11/23/19   Mcarthur Rossetti, MD  ondansetron (ZOFRAN ODT) 4 MG disintegrating tablet Take 1 tablet (4 mg total) by mouth every 8 (eight) hours as needed for nausea or vomiting. 11/24/19   Mcarthur Rossetti, MD  oxyCODONE (ROXICODONE) 5 MG immediate release tablet Take 1-2 tablets (5-10 mg total) by mouth every 6 (six) hours as needed for severe pain. 11/23/19   Mcarthur Rossetti, MD    Inpatient Medications: Scheduled Meds: . amisulpride      . HYDROmorphone      . oxyCODONE       Continuous Infusions: . acetaminophen    . ceFAZolin    . lactated ringers 10 mL/hr at 11/24/19 0713  .  lactated ringers    . methocarbamol (ROBAXIN) IV     PRN Meds: 0.9 % irrigation (POUR BTL), acetaminophen, acetaminophen **OR** acetaminophen (TYLENOL) oral liquid 160 mg/5 mL, [START ON 11/25/2019] acetaminophen, HYDROmorphone (DILAUDID) injection, HYDROmorphone (DILAUDID) injection, meperidine (DEMEROL) injection, methocarbamol **OR** methocarbamol (ROBAXIN) IV, metoCLOPramide **OR** metoCLOPramide (REGLAN) injection, ondansetron **OR** ondansetron (ZOFRAN) IV, oxyCODONE, oxyCODONE, sodium chloride irrigation, sterile water for irrigation  Allergies:   No Known  Allergies  Social History:   Social History   Socioeconomic History  . Marital status: Married    Spouse name: Not on file  . Number of children: Not on file  . Years of education: Not on file  . Highest education level: Not on file  Occupational History  . Not on file  Tobacco Use  . Smoking status: Current Some Day Smoker    Types: Cigars  . Smokeless tobacco: Never Used  . Tobacco comment: occ  Vaping Use  . Vaping Use: Never used  Substance and Sexual Activity  . Alcohol use: Yes    Alcohol/week: 2.0 standard drinks    Types: 2 Standard drinks or equivalent per week    Comment: occasional  . Drug use: No  . Sexual activity: Not on file  Other Topics Concern  . Not on file  Social History Narrative  . Not on file   Social Determinants of Health   Financial Resource Strain:   . Difficulty of Paying Living Expenses: Not on file  Food Insecurity:   . Worried About Charity fundraiser in the Last Year: Not on file  . Ran Out of Food in the Last Year: Not on file  Transportation Needs:   . Lack of Transportation (Medical): Not on file  . Lack of Transportation (Non-Medical): Not on file  Physical Activity:   . Days of Exercise per Week: Not on file  . Minutes of Exercise per Session: Not on file  Stress:   . Feeling of Stress : Not on file  Social Connections:   . Frequency of Communication with Friends and Family: Not on file  . Frequency of Social Gatherings with Friends and Family: Not on file  . Attends Religious Services: Not on file  . Active Member of Clubs or Organizations: Not on file  . Attends Archivist Meetings: Not on file  . Marital Status: Not on file  Intimate Partner Violence:   . Fear of Current or Ex-Partner: Not on file  . Emotionally Abused: Not on file  . Physically Abused: Not on file  . Sexually Abused: Not on file    Family History:    Family History  Problem Relation Age of Onset  . Other Mother        latent TB  .  Rheum arthritis Mother   . Heart failure Father   . Renal Disease Father      ROS:  Please see the history of present illness.  Denies any fevers chills nausea vomiting syncope bleeding. All other ROS reviewed and negative.     Physical Exam/Data:   Vitals:   11/24/19 1045 11/24/19 1100 11/24/19 1345 11/24/19 1400  BP: (!) 157/96 136/90 (!) 170/83 (!) 160/79  Pulse: 61 62 95 93  Resp: 14 14 15    Temp:  (!) 97.5 F (36.4 C) 97.8 F (36.6 C)   TempSrc:      SpO2: 100% 100% 98%   Weight:      Height:        Intake/Output  Summary (Last 24 hours) at 11/24/2019 1518 Last data filed at 11/24/2019 1015 Gross per 24 hour  Intake 1200 ml  Output 1350 ml  Net -150 ml   Last 3 Weights 11/24/2019 11/20/2019 11/13/2019  Weight (lbs) 229 lb 11.2 oz 229 lb 11.2 oz 220 lb  Weight (kg) 104.191 kg 104.191 kg 99.791 kg     Body mass index is 32.96 kg/m.  General:  Well nourished, well developed, in no acute distress HEENT: normal Lymph: no adenopathy Neck: no JVD Endocrine:  No thryomegaly Vascular: No carotid bruits; FA pulses 2+ bilaterally without bruits  Cardiac:  normal S1, S2; RRR; no murmur occasional irregularity Lungs:  clear to auscultation bilaterally, no wheezing, rhonchi or rales  Abd: soft, nontender, no hepatomegaly  Ext: no edema Musculoskeletal:  No deformities, BUE and BLE strength normal and equal Skin: warm and dry  Neuro:  CNs 2-12 intact, no focal abnormalities noted Psych:  Normal affect   EKG:  The EKG was personally reviewed and demonstrates: Typical atrial flutter heart rate 60  Laboratory Data:  High Sensitivity Troponin:  No results for input(s): TROPONINIHS in the last 720 hours.   ChemistryNo results for input(s): NA, K, CL, CO2, GLUCOSE, BUN, CREATININE, CALCIUM, GFRNONAA, GFRAA, ANIONGAP in the last 168 hours.  No results for input(s): PROT, ALBUMIN, AST, ALT, ALKPHOS, BILITOT in the last 168 hours. Hematology Recent Labs  Lab 11/20/19 0838  WBC  5.4  RBC 4.97  HGB 14.7  HCT 44.1  MCV 88.7  MCH 29.6  MCHC 33.3  RDW 13.2  PLT 216   BNPNo results for input(s): BNP, PROBNP in the last 168 hours.  DDimer No results for input(s): DDIMER in the last 168 hours.   Radiology/Studies:  DG Pelvis Portable  Result Date: 11/24/2019 CLINICAL DATA:  Status post right hip replacement. EXAM: PORTABLE PELVIS 1-2 VIEWS COMPARISON:  October 18, 2019. FINDINGS: The right femoral and acetabular components appear to be well situated. Expected postoperative changes are noted in the surrounding soft tissues. IMPRESSION: Status post right total hip arthroplasty. Electronically Signed   By: Marijo Conception M.D.   On: 11/24/2019 09:31   DG C-Arm 1-60 Min-No Report  Result Date: 11/24/2019 Fluoroscopy was utilized by the requesting physician.  No radiographic interpretation.   DG HIP OPERATIVE UNILAT W OR W/O PELVIS RIGHT  Result Date: 11/24/2019 CLINICAL DATA:  RIGHT hip replacement EXAM: OPERATIVE RIGHT HIP (WITH PELVIS IF PERFORMED) 6 VIEWS TECHNIQUE: Fluoroscopic spot image(s) were submitted for interpretation post-operatively. COMPARISON:  10/18/2019 FLUOROSCOPY TIME:  Not provided; please refer to operative records FINDINGS: Osseous demineralization. Advanced osteoarthritic changes RIGHT hip. Images demonstrate placement of a RIGHT hip prosthesis, which appears in expected position. No fracture, dislocation, or bone destruction. IMPRESSION: RIGHT hip prosthesis without acute abnormalities. Electronically Signed   By: Lavonia Dana M.D.   On: 11/24/2019 08:49     Assessment and Plan:   59 year old with right hip replacement, new onset atrial flutter with rate control.  Atrial flutter -Currently controlled heart rate.  Rate ranging between 60 and 95. EKG shows 4-1 conduction heart rate 60 with negative deflection in inferior leads compatible with typical atrial flutter. -Hopefully over the next 24 to 48 hours he will auto convert back to sinus  rhythm. -Given his current normal heart rate, I do not think he needs any rate control medications. -We will set him up for close outpatient follow-up, obtain an echocardiogram to ensure proper structure and function of his heart. -I  will go ahead and prescribe him diltiazem 60 mg short acting p.o. every 4 hours as needed in case he has breakthrough tachycardia at home. -Unable to anticoagulate with Eliquis since he is immediately postop.  Bleeding risks outweigh the benefit.  Also, chads vasc score is currently 0. -If he does remain in flutter however several days postop, we could consider trying to convert him after loading him with Eliquis.  Right hip replacement -Continue with current postop care.  During ambulation today he did not have any rapid ventricular response.  He is on aspirin.    For questions or updates, please contact [place] Please consult [email or web address] for contact info under    Signed, Candee Furbish, MD  11/24/2019 3:18 PM

## 2019-11-27 ENCOUNTER — Encounter (HOSPITAL_COMMUNITY): Payer: Self-pay | Admitting: Orthopaedic Surgery

## 2019-11-29 ENCOUNTER — Encounter: Payer: Self-pay | Admitting: Cardiology

## 2019-11-29 ENCOUNTER — Other Ambulatory Visit: Payer: Self-pay

## 2019-11-29 ENCOUNTER — Ambulatory Visit (INDEPENDENT_AMBULATORY_CARE_PROVIDER_SITE_OTHER): Payer: No Typology Code available for payment source | Admitting: Cardiology

## 2019-11-29 VITALS — BP 122/80 | HR 72 | Ht 70.0 in | Wt 228.0 lb

## 2019-11-29 DIAGNOSIS — I4892 Unspecified atrial flutter: Secondary | ICD-10-CM | POA: Diagnosis not present

## 2019-11-29 DIAGNOSIS — M1611 Unilateral primary osteoarthritis, right hip: Secondary | ICD-10-CM

## 2019-11-29 LAB — CBC WITH DIFFERENTIAL/PLATELET
Basophils Absolute: 0.1 10*3/uL (ref 0.0–0.2)
Basos: 1 %
EOS (ABSOLUTE): 0.3 10*3/uL (ref 0.0–0.4)
Eos: 4 %
Hematocrit: 40.3 % (ref 37.5–51.0)
Hemoglobin: 13.9 g/dL (ref 13.0–17.7)
Immature Grans (Abs): 0.1 10*3/uL (ref 0.0–0.1)
Immature Granulocytes: 1 %
Lymphocytes Absolute: 2.3 10*3/uL (ref 0.7–3.1)
Lymphs: 27 %
MCH: 29.7 pg (ref 26.6–33.0)
MCHC: 34.5 g/dL (ref 31.5–35.7)
MCV: 86 fL (ref 79–97)
Monocytes Absolute: 0.8 10*3/uL (ref 0.1–0.9)
Monocytes: 10 %
Neutrophils Absolute: 4.8 10*3/uL (ref 1.4–7.0)
Neutrophils: 57 %
Platelets: 301 10*3/uL (ref 150–450)
RBC: 4.68 x10E6/uL (ref 4.14–5.80)
RDW: 12.7 % (ref 11.6–15.4)
WBC: 8.3 10*3/uL (ref 3.4–10.8)

## 2019-11-29 LAB — BASIC METABOLIC PANEL
BUN/Creatinine Ratio: 19 (ref 10–24)
BUN: 18 mg/dL (ref 8–27)
CO2: 24 mmol/L (ref 20–29)
Calcium: 9 mg/dL (ref 8.6–10.2)
Chloride: 103 mmol/L (ref 96–106)
Creatinine, Ser: 0.93 mg/dL (ref 0.76–1.27)
GFR calc Af Amer: 103 mL/min/{1.73_m2} (ref >59)
GFR calc non Af Amer: 89 mL/min/{1.73_m2} (ref >59)
Glucose: 104 mg/dL — ABNORMAL HIGH (ref 65–99)
Potassium: 4.1 mmol/L (ref 3.5–5.2)
Sodium: 140 mmol/L (ref 134–144)

## 2019-11-29 MED ORDER — APIXABAN 5 MG PO TABS: 5.0000 mg | ORAL_TABLET | Freq: Two times a day (BID) | ORAL | 11 refills | Status: DC

## 2019-11-29 NOTE — Patient Instructions (Addendum)
Medication Instructions:  Your physician has recommended you make the following change in your medication:  1-STOP Aspirin 2-START Eliquis 5 mg by mouth twice daily. *If you need a refill on your cardiac medications before your next appointment, please call your pharmacy*  Lab Work: Your physician recommends that you have lab work today- BMET and CBC  If you have labs (blood work) drawn today and your tests are completely normal, you will receive your results only by:  MyChart Message (if you have MyChart) OR  A paper copy in the mail If you have any lab test that is abnormal or we need to change your treatment, we will call you to review the results.  Testing/Procedures: Your physician has recommended that you have a TEE/Cardioversion (DCCV). Electrical Cardioversion uses a jolt of electricity to your heart either through paddles or wired patches attached to your chest. This is a controlled, usually prescheduled, procedure. Defibrillation is done under light anesthesia in the hospital, and you usually go home the day of the procedure. This is done to get your heart back into a normal rhythm. You are not awake for the procedure. Please see the instruction sheet given to you today.  Follow-Up: At [facility], you and your health needs are our priority.  As part of our continuing mission to provide you with exceptional heart care, we have created designated Provider Care Teams.  These Care Teams include your primary Cardiologist (physician) and Advanced Practice Providers (APPs -  Physician Assistants and Nurse Practitioners) who all work together to provide you with the care you need, when you need it.  We recommend signing up for the patient portal called "MyChart".  Sign up information is provided on this After Visit Summary.  MyChart is used to connect with patients for Virtual Visits (Telemedicine).  Patients are able to view lab/test results, encounter notes, upcoming appointments, etc.   Non-urgent messages can be sent to your provider as well.   To learn more about what you can do with MyChart, go to [email or web address].    Your next appointment:   2 weeks  The format for your next appointment:   In Person  Provider:   You may see Candee Furbish, MD or one of the following Advanced Practice Providers on your designated Care Team:    Truitt Merle, NP  Cecilie Kicks, NP  Kathyrn Drown, NP   You are scheduled for a TEE/Cardioversion on 12/05/19 with Dr. Debara Pickett.  Please arrive at the [facility] (Main Entrance A) at [facility]: [address] [place], [place] [address] at 10:00 am. (1 hour prior to procedure unless lab work is needed; if lab work is needed arrive 1.5 hours ahead)  DIET: Nothing to eat or drink after midnight except a sip of water with medications (see medication instructions below)  Medication Instructions:  Continue your anticoagulant: Eliquis You will need to continue your anticoagulant after your procedure until you  are told by your  Provider that it is safe to stop  Labs: Today - CBC and BMET  Due to recent COVID-19 restrictions implemented by our local and state authorities and in an effort to keep both patients and staff as safe as possible, our hospital system requires COVID-19 testing prior to certain scheduled hospital procedures.  Please go to [place]. [employer], [place] [address] on 12/02/19 at 12:00  .  This is a drive up testing site.  You will not need to exit your vehicle.  You will  not be billed at the time of testing but may receive a bill later depending on your insurance. You must agree to self-quarantine from the time of your testing until the procedure date on 12/05/19.  This should included staying home with ONLY the people you live with.  Avoid take-out, grocery store shopping or leaving the house for any non-emergent reason.  Failure to have your COVID-19 test done on the date and time you have been scheduled will  result in cancellation of your procedure.  Please call our office at [phone] if you have any questions.   You must have a responsible person to drive you home and stay in the waiting area during your procedure. Failure to do so could result in cancellation.  Bring your insurance cards.  *Special Note: Every effort is made to have your procedure done on time. Occasionally there are emergencies that occur at the hospital that may cause delays. Please be patient if a delay does occur.

## 2019-11-29 NOTE — Progress Notes (Signed)
Cardiology Office Note:    Date:  11/29/2019   ID:  [name], DOB 08-Nov-1960, MRN [id number]  PCP:  [name], MD  [facility] Cardiologist:  Candee Furbish, MD  [facility] Electrophysiologist:  None   Referring MD: [name], MD     History of Present Illness:    [name] is a 59 y.o. male seen last week with perioperative atrial flutter in the setting of right hip replacement, Dr. [name].  He was asymptomatic at the time.  Tachycardia had improved while in the PACU.  He was conducting a 41 fashion at a heart rate around 60.  He was given diltiazem 60 mg as needed in case tachycardia ensued.  I discussed his case with his wife, Dr. Lurene Shadow.  He was able to ambulate postoperative without any rapid ventricular response.  We did not use any anticoagulation because of the immediate postop bleeding risks.  He was on aspirin postop.  No diabetes no prior stroke no prior coronary artery disease.  Occasionally have a pipe or cigar.  No stimulants.  Father had heart failure at a later age.  Past Medical History:  Diagnosis Date   Arthritis    back   History of kidney stones    LTBI (latent tuberculosis infection)    treated (latent) with rifampin, tested positive 30 years ago    Past Surgical History:  Procedure Laterality Date   COLONOSCOPY     HEMANGIOMA EXCISION     Dr. [name]   LAMINECTOMY AND MICRODISCECTOMY LUMBAR SPINE  1999   L5-S1   LITHOTRIPSY     ORIF SHOULDER FRACTURE Right 11/29/2018   Procedure: RIGHT OPEN REDUCTION INTERNAL FIXATION (ORIF) SHOULDER FRACTURE;  Surgeon: [name], MD;  Location: [facility];  Service: Orthopedics;  Laterality: Right;   TOTAL HIP ARTHROPLASTY Right 11/24/2019   Procedure: RIGHT TOTAL HIP ARTHROPLASTY ANTERIOR APPROACH;  Surgeon: Mcarthur Rossetti, MD;  Location: [facility];  Service: Orthopedics;  Laterality: Right;    Current Medications: Current Meds  Medication Sig   diltiazem  (CARDIZEM) 60 MG tablet Take 1 tablet (60 mg total) by mouth 4 (four) times daily as needed (rapid heart rate).   loratadine (CLARITIN) 10 MG tablet Take 10 mg by mouth daily.   meloxicam (MOBIC) 15 MG tablet Take 15 mg by mouth daily.    methocarbamol (ROBAXIN) 500 MG tablet Take 1 tablet (500 mg total) by mouth every 6 (six) hours as needed.   ondansetron (ZOFRAN ODT) 4 MG disintegrating tablet Take 1 tablet (4 mg total) by mouth every 8 (eight) hours as needed for nausea or vomiting.   oxyCODONE (ROXICODONE) 5 MG immediate release tablet Take 1-2 tablets (5-10 mg total) by mouth every 6 (six) hours as needed for severe pain.   psyllium (METAMUCIL) 28 % packet Take 1 packet by mouth 2 (two) times daily.   triamcinolone (NASACORT ALLERGY 24HR) 55 MCG/ACT AERO nasal inhaler Place 2 sprays into the nose daily as needed (allergies).   zolpidem (AMBIEN) 10 MG tablet Take 10 mg by mouth at bedtime as needed for sleep.   [DISCONTINUED] aspirin (ASPIRIN CHILDRENS) 81 MG chewable tablet Chew 1 tablet (81 mg total) by mouth 2 (two) times daily after a meal.     Allergies:   Patient has no known allergies.   Social History   Socioeconomic History   Marital status: Married    Spouse name: Not on file   Number of children: Not on file  Years of education: Not on file   Highest education level: Not on file  Occupational History   Not on file  Tobacco Use   Smoking status: Current Some Day Smoker    Types: Cigars   Smokeless tobacco: Never Used   Tobacco comment: occ  Vaping Use   Vaping Use: Never used  Substance and Sexual Activity   Alcohol use: Yes    Alcohol/week: 2.0 standard drinks    Types: 2 Standard drinks or equivalent per week    Comment: occasional   Drug use: No   Sexual activity: Not on file  Other Topics Concern   Not on file  Social History Narrative   Not on file   Social Determinants of Health   Financial Resource Strain:    Difficulty of  Paying Living Expenses: Not on file  Food Insecurity:    Worried About [place] in the Last Year: Not on file   Ran Out of Food in the Last Year: Not on file  Transportation Needs:    Lack of Transportation (Medical): Not on file   Lack of Transportation (Non-Medical): Not on file  Physical Activity:    Days of Exercise per Week: Not on file   Minutes of Exercise per Session: Not on file  Stress:    Feeling of Stress : Not on file  Social Connections:    Frequency of Communication with Friends and Family: Not on file   Frequency of Social Gatherings with Friends and Family: Not on file   Attends Religious Services: Not on file   Active Member of Clubs or Organizations: Not on file   Attends Archivist Meetings: Not on file   Marital Status: Not on file     Family History: The patient's family history includes Heart failure in his father; Other in his mother; Renal Disease in his father; Rheum arthritis in his mother.  ROS:   Please see the history of present illness.     All other systems reviewed and are negative.  EKGs/Labs/Other Studies Reviewed:    The following studies were reviewed today: Echocardiogram pending  EKG:  EKG is  ordered today.  The ekg ordered today demonstrates Atrial flutter with variable conduction Prior shows typical atrial flutter 4:1 pattern heart rate 60  Recent Labs: 11/29/2018: BUN 13; Creatinine, Ser 0.79; Potassium 3.5; Sodium 138 11/20/2019: Hemoglobin 14.7; Platelets 216  Recent Lipid Panel No results found for: CHOL, TRIG, HDL, CHOLHDL, VLDL, LDLCALC, LDLDIRECT   Risk Assessment/Calculations:     CHA2DS2-VASc Score = 0  This indicates a 0.2% annual risk of stroke. The patient's score is based upon: CHF History: 0 HTN History: 0 Diabetes History: 0 Stroke History: 0 Vascular Disease History: 0 Age Score: 0 Gender Score: 0       Physical Exam:    VS:  BP 122/80    Pulse 72    Ht 5\' 10"  (1.778  m)    Wt 228 lb (103.4 kg)    SpO2 97%    BMI 32.71 kg/m     Wt Readings from Last 3 Encounters:  11/29/19 228 lb (103.4 kg)  11/24/19 229 lb 11.2 oz (104.2 kg)  11/20/19 229 lb 11.2 oz (104.2 kg)     GEN:  Well nourished, well developed in no acute distress HEENT: Normal NECK: No JVD; No carotid bruits LYMPHATICS: No lymphadenopathy CARDIAC: Slightly irregular, no murmurs, rubs, gallops RESPIRATORY:  Clear to auscultation without rales, wheezing or rhonchi  ABDOMEN: Soft, non-tender, non-distended MUSCULOSKELETAL:  No edema; No deformity  SKIN: Warm and dry-reported right hip mild ecchymosis NEUROLOGIC:  Alert and oriented x 3 PSYCHIATRIC:  Normal affect   ASSESSMENT:    1. Atrial flutter, unspecified type ([place])   2. Unilateral primary osteoarthritis, right hip    PLAN:    In order of problems listed above:  Paroxysmal typical atrial flutter -He was conducting in a 4:1 conduction pattern postoperatively when I saw him in consultation in the [facility], PACU following his right hip replacement. -Since it has been 5 days postop, we will go ahead and start Eliquis 5 mg twice a day.  We will stop his aspirin. -We will set him up for a TEE cardioversion.  Risk and benefits have been explained including stroke, bradycardia.  His wife, Dr. Lurene Shadow was present for discussion. -Explained to him the pathophysiology behind typical atrial flutter.  If this were to return, could always consider flutter ablation. -We'll go ahead and get him set up for cardioversion..  Earliest available for TEE cardioversion is Tuesday with Dr. Debara Pickett.  Status post right hip arthroplasty -Anterior approach.  He does have some typical bruising post procedure.    Candee Furbish, MD     Medication Adjustments/Labs and Tests Ordered: Current medicines are reviewed at length with the patient today.  Concerns regarding medicines are outlined above.  Orders Placed This Encounter  Procedures   Basic  metabolic panel   CBC with Differential/Platelet   EKG 12-Lead   Meds ordered this encounter  Medications   apixaban (ELIQUIS) 5 MG TABS tablet    Sig: Take 1 tablet (5 mg total) by mouth 2 (two) times daily.    Dispense:  60 tablet    Refill:  11    Patient Instructions  Medication Instructions:  Your physician has recommended you make the following change in your medication:  1-STOP Aspirin 2-START Eliquis 5 mg by mouth twice daily. *If you need a refill on your cardiac medications before your next appointment, please call your pharmacy*  Lab Work: Your physician recommends that you have lab work today- BMET and CBC  If you have labs (blood work) drawn today and your tests are completely normal, you will receive your results only by:  MyChart Message (if you have MyChart) OR  A paper copy in the mail If you have any lab test that is abnormal or we need to change your treatment, we will call you to review the results.  Testing/Procedures: Your physician has recommended that you have a TEE/Cardioversion (DCCV). Electrical Cardioversion uses a jolt of electricity to your heart either through paddles or wired patches attached to your chest. This is a controlled, usually prescheduled, procedure. Defibrillation is done under light anesthesia in the hospital, and you usually go home the day of the procedure. This is done to get your heart back into a normal rhythm. You are not awake for the procedure. Please see the instruction sheet given to you today.  Follow-Up: At [facility], you and your health needs are our priority.  As part of our continuing mission to provide you with exceptional heart care, we have created designated Provider Care Teams.  These Care Teams include your primary Cardiologist (physician) and Advanced Practice Providers (APPs -  Physician Assistants and Nurse Practitioners) who all work together to provide you with the care you need, when you need it.  We  recommend signing up for the patient portal called "MyChart".  Sign  up information is provided on this After Visit Summary.  MyChart is used to connect with patients for Virtual Visits (Telemedicine).  Patients are able to view lab/test results, encounter notes, upcoming appointments, etc.  Non-urgent messages can be sent to your provider as well.   To learn more about what you can do with MyChart, go to [email or web address].    Your next appointment:   2 weeks  The format for your next appointment:   In Person  Provider:   You may see Candee Furbish, MD or one of the following Advanced Practice Providers on your designated Care Team:    Truitt Merle, NP  Cecilie Kicks, NP  Kathyrn Drown, NP   You are scheduled for a TEE/Cardioversion on 12/05/19 with Dr. Debara Pickett.  Please arrive at the [facility] (Main Entrance A) at [facility]: [address] [place], [place] [address] at 10:00 am. (1 hour prior to procedure unless lab work is needed; if lab work is needed arrive 1.5 hours ahead)  DIET: Nothing to eat or drink after midnight except a sip of water with medications (see medication instructions below)  Medication Instructions:  Continue your anticoagulant: Eliquis You will need to continue your anticoagulant after your procedure until you  are told by your  Provider that it is safe to stop  Labs: Today - CBC and BMET  Due to recent COVID-19 restrictions implemented by our local and state authorities and in an effort to keep both patients and staff as safe as possible, our hospital system requires COVID-19 testing prior to certain scheduled hospital procedures.  Please go to [place]. [place], [place] [id number] on 12/02/19 at 12:00  .  This is a drive up testing site.  You will not need to exit your vehicle.  You will not be billed at the time of testing but may receive a bill later depending on your insurance. You must agree to self-quarantine from the time of your testing  until the procedure date on 12/05/19.  This should included staying home with ONLY the people you live with.  Avoid take-out, grocery store shopping or leaving the house for any non-emergent reason.  Failure to have your COVID-19 test done on the date and time you have been scheduled will result in cancellation of your procedure.  Please call our office at [phone] if you have any questions.   You must have a responsible person to drive you home and stay in the waiting area during your procedure. Failure to do so could result in cancellation.  Bring your insurance cards.  *Special Note: Every effort is made to have your procedure done on time. Occasionally there are emergencies that occur at the hospital that may cause delays. Please be patient if a delay does occur.       Signed, Candee Furbish, MD  11/29/2019 10:00 AM    [facility]

## 2019-12-02 ENCOUNTER — Other Ambulatory Visit (HOSPITAL_COMMUNITY)
Admission: RE | Admit: 2019-12-02 | Discharge: 2019-12-02 | Disposition: A | Discharge status: Home / Self Care | Payer: BLUE CROSS/BLUE SHIELD | Source: Ambulatory Visit | Attending: Internal Medicine | Admitting: Internal Medicine

## 2019-12-02 DIAGNOSIS — Z20822 Contact with and (suspected) exposure to covid-19: Secondary | ICD-10-CM | POA: Diagnosis not present

## 2019-12-02 DIAGNOSIS — Z01818 Encounter for other preprocedural examination: Secondary | ICD-10-CM | POA: Diagnosis present

## 2019-12-03 LAB — SARS CORONAVIRUS 2 (TAT 6-24 HRS): SARS Coronavirus 2: NEGATIVE

## 2019-12-05 ENCOUNTER — Ambulatory Visit (HOSPITAL_BASED_OUTPATIENT_CLINIC_OR_DEPARTMENT_OTHER)
Admission: RE | Admit: 2019-12-05 | Discharge: 2019-12-05 | Disposition: A | Discharge status: Home / Self Care | Payer: BLUE CROSS/BLUE SHIELD | Source: Ambulatory Visit | Attending: Cardiology | Admitting: Cardiology

## 2019-12-05 ENCOUNTER — Other Ambulatory Visit: Payer: Self-pay

## 2019-12-05 ENCOUNTER — Encounter (HOSPITAL_COMMUNITY): Payer: Self-pay | Admitting: Internal Medicine

## 2019-12-05 ENCOUNTER — Encounter (HOSPITAL_COMMUNITY)
Admission: RE | Disposition: A | Discharge status: Home / Self Care | Payer: Self-pay | Source: Home / Self Care | Attending: Internal Medicine

## 2019-12-05 ENCOUNTER — Ambulatory Visit (HOSPITAL_COMMUNITY)
Admission: RE | Admit: 2019-12-05 | Discharge: 2019-12-05 | Disposition: A | Discharge status: Home / Self Care | Payer: BLUE CROSS/BLUE SHIELD | Attending: Internal Medicine | Admitting: Internal Medicine

## 2019-12-05 ENCOUNTER — Ambulatory Visit (HOSPITAL_COMMUNITY): Payer: BLUE CROSS/BLUE SHIELD | Admitting: Certified Registered"

## 2019-12-05 DIAGNOSIS — I483 Typical atrial flutter: Secondary | ICD-10-CM | POA: Diagnosis present

## 2019-12-05 DIAGNOSIS — Z791 Long term (current) use of non-steroidal anti-inflammatories (NSAID): Secondary | ICD-10-CM | POA: Insufficient documentation

## 2019-12-05 DIAGNOSIS — Q211 Atrial septal defect: Secondary | ICD-10-CM | POA: Diagnosis not present

## 2019-12-05 DIAGNOSIS — Z96641 Presence of right artificial hip joint: Secondary | ICD-10-CM | POA: Diagnosis not present

## 2019-12-05 DIAGNOSIS — Z7982 Long term (current) use of aspirin: Secondary | ICD-10-CM | POA: Insufficient documentation

## 2019-12-05 DIAGNOSIS — I4892 Unspecified atrial flutter: Secondary | ICD-10-CM

## 2019-12-05 DIAGNOSIS — M1611 Unilateral primary osteoarthritis, right hip: Secondary | ICD-10-CM

## 2019-12-05 DIAGNOSIS — F17290 Nicotine dependence, other tobacco product, uncomplicated: Secondary | ICD-10-CM | POA: Insufficient documentation

## 2019-12-05 DIAGNOSIS — Z79899 Other long term (current) drug therapy: Secondary | ICD-10-CM | POA: Diagnosis not present

## 2019-12-05 HISTORY — PX: BUBBLE STUDY: SHX6837

## 2019-12-05 HISTORY — PX: TEE WITHOUT CARDIOVERSION: SHX5443

## 2019-12-05 HISTORY — PX: CARDIOVERSION: SHX1299

## 2019-12-05 HISTORY — DX: Sleep apnea, unspecified: G47.30

## 2019-12-05 SURGERY — ECHOCARDIOGRAM, TRANSESOPHAGEAL
Anesthesia: Monitor Anesthesia Care

## 2019-12-05 MED ORDER — PROPOFOL 10 MG/ML IV BOLUS
INTRAVENOUS | Status: DC | PRN
  Administered 2019-12-05: 80 mg via INTRAVENOUS
  Administered 2019-12-05: 20 mg via INTRAVENOUS

## 2019-12-05 MED ORDER — PROPOFOL 500 MG/50ML IV EMUL
INTRAVENOUS | Status: DC | PRN
  Administered 2019-12-05: 100 ug/kg/min via INTRAVENOUS

## 2019-12-05 MED ORDER — BUTAMBEN-TETRACAINE-BENZOCAINE 2-2-14 % EX AERO
INHALATION_SPRAY | CUTANEOUS | Status: DC | PRN
  Administered 2019-12-05: 2 via TOPICAL

## 2019-12-05 MED ORDER — SODIUM CHLORIDE 0.9 % IV SOLN: INTRAVENOUS | Status: DC

## 2019-12-05 MED ORDER — LIDOCAINE 2% (20 MG/ML) 5 ML SYRINGE
INTRAMUSCULAR | Status: DC | PRN
  Administered 2019-12-05: 100 mg via INTRAVENOUS

## 2019-12-05 NOTE — Anesthesia Preprocedure Evaluation (Signed)
Anesthesia Evaluation  Patient identified by MRN, date of birth, ID band Patient awake    Reviewed: Allergy & Precautions, H&P , NPO status , Patient's Chart, lab work & pertinent test results  Airway Mallampati: II   Neck ROM: full    Dental   Pulmonary sleep apnea , Current Smoker,    breath sounds clear to auscultation       Cardiovascular negative cardio ROS   Rhythm:regular Rate:Normal     Neuro/Psych    GI/Hepatic   Endo/Other    Renal/GU      Musculoskeletal  (+) Arthritis ,   Abdominal   Peds  Hematology   Anesthesia Other Findings   Reproductive/Obstetrics                             Anesthesia Physical Anesthesia Plan  ASA: III  Anesthesia Plan: MAC   Post-op Pain Management:    Induction: Intravenous  PONV Risk Score and Plan: 1 and Propofol infusion and Treatment may vary due to age or medical condition  Airway Management Planned: Nasal Cannula  Additional Equipment:   Intra-op Plan:   Post-operative Plan:   Informed Consent: I have reviewed the patients History and Physical, chart, labs and discussed the procedure including the risks, benefits and alternatives for the proposed anesthesia with the patient or authorized representative who has indicated his/her understanding and acceptance.       Plan Discussed with: CRNA, Anesthesiologist and Surgeon  Anesthesia Plan Comments:         Anesthesia Quick Evaluation

## 2019-12-05 NOTE — Discharge Instructions (Signed)
Monitored Anesthesia Care, Care After These instructions provide you with information about caring for yourself after your procedure. Your health care provider may also give you more specific instructions. Your treatment has been planned according to current medical practices, but problems sometimes occur. Call your health care provider if you have any problems or questions after your procedure. What can I expect after the procedure? After your procedure, you may:  Feel sleepy for several hours.  Feel clumsy and have poor balance for several hours.  Feel forgetful about what happened after the procedure.  Have poor judgment for several hours.  Feel nauseous or vomit.  Have a sore throat if you had a breathing tube during the procedure. Follow these instructions at home: For at least 24 hours after the procedure:      Have a responsible adult stay with you. It is important to have someone help care for you until you are awake and alert.  Rest as needed.  Do not: ? Participate in activities in which you could fall or become injured. ? Drive. ? Use heavy machinery. ? Drink alcohol. ? Take sleeping pills or medicines that cause drowsiness. ? Make important decisions or sign legal documents. ? Take care of children on your own. Eating and drinking  Follow the diet that is recommended by your health care provider.  If you vomit, drink water, juice, or soup when you can drink without vomiting.  Make sure you have little or no nausea before eating solid foods. General instructions  Take over-the-counter and prescription medicines only as told by your health care provider.  If you have sleep apnea, surgery and certain medicines can increase your risk for breathing problems. Follow instructions from your health care provider about wearing your sleep device: ? Anytime you are sleeping, including during daytime naps. ? While taking prescription pain medicines, sleeping medicines,  or medicines that make you drowsy.  If you smoke, do not smoke without supervision.  Keep all follow-up visits as told by your health care provider. This is important. Contact a health care provider if:  You keep feeling nauseous or you keep vomiting.  You feel light-headed.  You develop a rash.  You have a fever. Get help right away if:  You have trouble breathing. Summary  For several hours after your procedure, you may feel sleepy and have poor judgment.  Have a responsible adult stay with you for at least 24 hours or until you are awake and alert. This information is not intended to replace advice given to you by your health care provider. Make sure you discuss any questions you have with your health care provider. Document Revised: 05/03/2017 Document Reviewed: 05/26/2015 Elsevier Patient Education  [place]. Transesophageal Echocardiogram Transesophageal echocardiogram (TEE) is a test that uses sound waves to take pictures of your heart. TEE is done by passing a flexible tube down the esophagus. The esophagus is the tube that carries food from the throat to the stomach. The pictures give detailed images of your heart. This can help your doctor see if there are problems with your heart. What happens before the procedure? Staying hydrated Follow instructions from your doctor about hydration, which may include:  Up to 3 hours before the procedure - you may continue to drink clear liquids, such as: ? Water. ? Clear fruit juice. ? Black coffee. ? Plain tea.  Eating and drinking Follow instructions from your doctor about eating and drinking, which may include:  8 hours before the  procedure - stop eating heavy meals or foods such as meat, fried foods, or fatty foods.  6 hours before the procedure - stop eating light meals or foods, such as toast or cereal.  6 hours before the procedure - stop drinking milk or drinks that contain milk.  3 hours before the  procedure - stop drinking clear liquids. General instructions  You will need to take out any dentures or retainers.  Plan to have someone take you home from the hospital or clinic.  If you will be going home right after the procedure, plan to have someone with you for 24 hours.  Ask your doctor about: ? Changing or stopping your normal medicines. This is important if you take diabetes medicines or blood thinners. ? Taking over-the-counter medicines, vitamins, herbs, and supplements. ? Taking medicines such as aspirin and ibuprofen. These medicines can thin your blood. Do not take these medicines unless your doctor tells you to take them. What happens during the procedure?  To lower your risk of infection, your doctors will wash or clean their hands.  An IV will be put into one of your veins.  You will be given a medicine to help you relax (sedative).  A medicine may be sprayed or gargled. This numbs the back of your throat.  Your blood pressure, heart rate, and breathing will be watched.  You may be asked to lay on your left side.  A bite block will be placed in your mouth. This keeps you from biting the tube.  The tip of the TEE probe will be placed into the back of your mouth.  You will be asked to swallow.  Your doctor will take pictures of your heart.  The probe and bite block will be taken out. The procedure may vary among doctors and hospitals. What happens after the procedure?   Your blood pressure, heart rate, breathing rate, and blood oxygen level will be watched until the medicines you were given have worn off.  When you first wake up, your throat may feel sore and numb. This will get better over time. You will not be allowed to eat or drink until the numbness has gone away.  Do not drive for 24 hours if you were given a medicine to help you relax. Summary  TEE is a test that uses sound waves to take pictures of your heart.  You will be given a medicine to  help you relax.  Do not drive for 24 hours if you were given a medicine to help you relax. This information is not intended to replace advice given to you by your health care provider. Make sure you discuss any questions you have with your health care provider. Document Revised: 10/22/2017 Document Reviewed: 05/06/2016 Elsevier Patient Education  [place]. Hospital doctor cardioversion is the delivery of a jolt of electricity to restore a normal rhythm to the heart. A rhythm that is too fast or is not regular keeps the heart from pumping well. In this procedure, sticky patches or metal paddles are placed on the chest to deliver electricity to the heart from a device. This procedure may be done in an emergency if:  There is low or no blood pressure as a result of the heart rhythm.  Normal rhythm must be restored as fast as possible to protect the brain and heart from further damage.  It may save a life. This may also be a scheduled procedure for irregular or fast heart rhythms  that are not immediately life-threatening. Tell a health care provider about:  Any allergies you have.  All medicines you are taking, including vitamins, herbs, eye drops, creams, and over-the-counter medicines.  Any problems you or family members have had with anesthetic medicines.  Any blood disorders you have.  Any surgeries you have had.  Any medical conditions you have.  Whether you are pregnant or may be pregnant. What are the risks? Generally, this is a safe procedure. However, problems may occur, including:  Allergic reactions to medicines.  A blood clot that breaks free and travels to other parts of your body.  The possible return of an abnormal heart rhythm within hours or days after the procedure.  Your heart stopping (cardiac arrest). This is rare. What happens before the procedure? Medicines  Your health care provider may have you start  taking: ? Blood-thinning medicines (anticoagulants) so your blood does not clot as easily. ? Medicines to help stabilize your heart rate and rhythm.  Ask your health care provider about: ? Changing or stopping your regular medicines. This is especially important if you are taking diabetes medicines or blood thinners. ? Taking medicines such as aspirin and ibuprofen. These medicines can thin your blood. Do not take these medicines unless your health care provider tells you to take them. ? Taking over-the-counter medicines, vitamins, herbs, and supplements. General instructions  Follow instructions from your health care provider about eating or drinking restrictions.  Plan to have someone take you home from the hospital or clinic.  If you will be going home right after the procedure, plan to have someone with you for 24 hours.  Ask your health care provider what steps will be taken to help prevent infection. These may include washing your skin with a germ-killing soap. What happens during the procedure?   An IV will be inserted into one of your veins.  Sticky patches (electrodes) or metal paddles may be placed on your chest.  You will be given a medicine to help you relax (sedative).  An electrical shock will be delivered. The procedure may vary among health care providers and hospitals. What can I expect after the procedure?  Your blood pressure, heart rate, breathing rate, and blood oxygen level will be monitored until you leave the hospital or clinic.  Your heart rhythm will be watched to make sure it does not change.  You may have some redness on the skin where the shocks were given. Follow these instructions at home:  Do not drive for 24 hours if you were given a sedative during your procedure.  Take over-the-counter and prescription medicines only as told by your health care provider.  Ask your health care provider how to check your pulse. Check it often.  Rest for 48  hours after the procedure or as told by your health care provider.  Avoid or limit your caffeine use as told by your health care provider.  Keep all follow-up visits as told by your health care provider. This is important. Contact a health care provider if:  You feel like your heart is beating too quickly or your pulse is not regular.  You have a serious muscle cramp that does not go away. Get help right away if:  You have discomfort in your chest.  You are dizzy or you feel faint.  You have trouble breathing or you are short of breath.  Your speech is slurred.  You have trouble moving an arm or leg on one side of  your body.  Your fingers or toes turn cold or blue. Summary  Electrical cardioversion is the delivery of a jolt of electricity to restore a normal rhythm to the heart.  This procedure may be done right away in an emergency or may be a scheduled procedure if the condition is not an emergency.  Generally, this is a safe procedure.  After the procedure, check your pulse often as told by your health care provider. This information is not intended to replace advice given to you by your health care provider. Make sure you discuss any questions you have with your health care provider. Document Revised: 09/05/2018 Document Reviewed: 09/05/2018 Elsevier Patient Education  [place].

## 2019-12-05 NOTE — Progress Notes (Signed)
  Echocardiogram Echocardiogram Transesophageal has been performed.  Jennette Dubin 12/05/2019, 11:38 AM

## 2019-12-05 NOTE — Transfer of Care (Signed)
Immediate Anesthesia Transfer of Care Note  Patient: [name]  Procedure(s) Performed: TRANSESOPHAGEAL ECHOCARDIOGRAM (TEE) (N/A ) CARDIOVERSION (N/A ) BUBBLE STUDY  Patient Location: PACU  Anesthesia Type:General  Level of Consciousness: awake, alert , oriented and patient cooperative  Airway & Oxygen Therapy: Patient Spontanous Breathing and Patient connected to face mask oxygen  Post-op Assessment: Report given to RN and Post -op Vital signs reviewed and stable  Post vital signs: Reviewed and stable  Last Vitals:  Vitals Value Taken Time  BP 113/76 12/05/19 1123  Temp    Pulse 67 12/05/19 1125  Resp 12 12/05/19 1125  SpO2 100 % 12/05/19 1125  Vitals shown include unvalidated device data.  Last Pain:  Vitals:   12/05/19 1123  TempSrc:   PainSc: 0-No pain         Complications: No complications documented.

## 2019-12-05 NOTE — CV Procedure (Signed)
TEE/CARDIOVERSION NOTE  TRANSESOPHAGEAL ECHOCARDIOGRAM (TEE):  Indictation: Atrial Flutter  Consent:   Informed consent was obtained prior to the procedure. The risks, benefits and alternatives for the procedure were discussed and the patient comprehended these risks.  Risks include, but are not limited to, cough, sore throat, vomiting, nausea, somnolence, esophageal and stomach trauma or perforation, bleeding, low blood pressure, aspiration, pneumonia, infection, trauma to the teeth and death.    Time Out: Verified patient identification, verified procedure, site/side was marked, verified correct patient position, special equipment/implants available, medications/allergies/relevent history reviewed, required imaging and test results available. Performed  Procedure:  After a procedural time-out, the patient was given propofol per anesthesia for sedation. The patient's heart rate, blood pressure, and oxygen saturation are monitored continuously during the procedure. The oropharynx was anesthetized with topical cetacaine.  The transesophageal probe was inserted in the esophagus and stomach without difficulty and multiple views were obtained. Agitated microbubble saline contrast was administered.  Complications:    Complications: None Patient did tolerate procedure well.  Findings:  1. LEFT VENTRICLE: The left ventricular wall thickness is mildly increased.  The left ventricular cavity is normal in size. Wall motion is normal.  LVEF is 55-60%.  2. RIGHT VENTRICLE:  The right ventricle is normal in structure and function without any thrombus or masses.    3. LEFT ATRIUM:  The left atrium is normal in size without any thrombus or masses.  There is not spontaneous echo contrast ("smoke") in the left atrium consistent with a low flow state.  4. LEFT ATRIAL APPENDAGE:  The left atrial appendage is free of any thrombus or masses. The appendage has single lobes. Pulse doppler indicates  moderate flow in the appendage.  5. ATRIAL SEPTUM:  The atrial septum is aneurysmal.  There is evidence for left to right interatrial shunting by color doppler consistent with a small PFO. No evidence for right to left shunting with saline microbubble contrast and provocation.  6. RIGHT ATRIUM:  The right atrium is normal in size and function without any thrombus or masses.  7. MITRAL VALVE:  The mitral valve is normal in structure and function with trivial regurgitation.  There were no vegetations or stenosis.  8. AORTIC VALVE:  The aortic valve is trileaflet, normal in structure and function with trivial regurgitation.  There were no vegetations or stenosis  9. TRICUSPID VALVE:  The tricuspid valve is normal in structure and function with trivial regurgitation.  There were no vegetations or stenosis  10.  PULMONIC VALVE:  The pulmonic valve is normal in structure and function with trivial regurgitation.  There were no vegetations or stenosis.   11. AORTIC ARCH, ASCENDING AND DESCENDING AORTA:  There was no Ron Parker et. Al, 1992) atherosclerosis of the ascending aorta, aortic arch, or proximal descending aorta.  12. PULMONARY VEINS: Anomalous pulmonary venous return was not noted.  13. PERICARDIUM: The pericardium appeared normal and non-thickened.  There is no pericardial effusion.  CARDIOVERSION:     Second Time Out: Verified patient identification, verified procedure, site/side was marked, verified correct patient position, special equipment/implants available, medications/allergies/relevent history reviewed, required imaging and test results available.  Performed  Procedure:  1. Patient placed on cardiac monitor, pulse oximetry, supplemental oxygen as necessary.  2. Sedation administered per anesthesia 3. Pacer pads placed anterior and posterior chest. 4. Cardioverted 1 time(s).  5. Cardioverted at 75J biphasic.  Complications:  Complications: None Patient did tolerate procedure  well.  Impression:  1. No LAA thrombus 2. 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 Normal biatrial size 4. Mild LVH 5. Normal wall motion 6. LVEF 55-60% 7. Trivial AR, MR, TR, PR 8. Successful DCCV with a single 75J biphasic shock.  Recommendations:  1. Follow-up with Dr. Marlou Porch. Notified patient and his wife (an ER physician) of the finding of small PFO with left to right, but not right to left shunt. 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 Review of the literature indicates he could be at increased risk of air embolism. Advised he discuss this with an undersea medicine specialist.  Time Spent Directly with the Patient:  45 minutes   Pixie Casino, MD, [facility], [place] Director of the [facility] Diplomate of the American Board of Clinical Lipidology Attending Cardiologist  Direct Dial: [phone]  Fax: [phone]  Website:  [email or web address] 12/05/2019, 11:18 AM

## 2019-12-05 NOTE — Interval H&P Note (Signed)
History and Physical Interval Note:  12/05/2019 10:41 AM  [name]  has presented today for surgery, with the diagnosis of A-FIB.  The various methods of treatment have been discussed with the patient and family. After consideration of risks, benefits and other options for treatment, the patient has consented to  Procedure(s): TRANSESOPHAGEAL ECHOCARDIOGRAM (TEE) (N/A) CARDIOVERSION (N/A) as a surgical intervention.  The patient's history has been reviewed, patient examined, no change in status, stable for surgery.  I have reviewed the patient's chart and labs.  Questions were answered to the patient's satisfaction.     Pixie Casino

## 2019-12-06 ENCOUNTER — Encounter (HOSPITAL_COMMUNITY): Payer: Self-pay | Admitting: Internal Medicine

## 2019-12-06 NOTE — Anesthesia Postprocedure Evaluation (Signed)
Anesthesia Post Note  Patient: [name]  Procedure(s) Performed: TRANSESOPHAGEAL ECHOCARDIOGRAM (TEE) (N/A ) CARDIOVERSION (N/A ) BUBBLE STUDY     Patient location during evaluation: Endoscopy Anesthesia Type: MAC Level of consciousness: awake and alert Pain management: pain level controlled Vital Signs Assessment: post-procedure vital signs reviewed and stable Respiratory status: spontaneous breathing, nonlabored ventilation, respiratory function stable and patient connected to nasal cannula oxygen Cardiovascular status: blood pressure returned to baseline and stable Postop Assessment: no apparent nausea or vomiting Anesthetic complications: no   No complications documented.  Last Vitals:  Vitals:   12/05/19 1133 12/05/19 1145  BP: 120/77 125/85  Pulse: 70 72  Resp: 16 17  Temp:    SpO2: 100% 100%    Last Pain:  Vitals:   12/05/19 1145  TempSrc:   PainSc: 0-No pain                 Latrish Mogel S

## 2019-12-07 ENCOUNTER — Ambulatory Visit (INDEPENDENT_AMBULATORY_CARE_PROVIDER_SITE_OTHER): Payer: No Typology Code available for payment source | Admitting: Orthopaedic Surgery

## 2019-12-07 ENCOUNTER — Encounter: Payer: Self-pay | Admitting: Orthopaedic Surgery

## 2019-12-07 ENCOUNTER — Telehealth: Payer: Self-pay | Admitting: Orthopaedic Surgery

## 2019-12-07 ENCOUNTER — Other Ambulatory Visit: Payer: Self-pay | Admitting: Orthopaedic Surgery

## 2019-12-07 DIAGNOSIS — Z96641 Presence of right artificial hip joint: Secondary | ICD-10-CM

## 2019-12-07 MED ORDER — HYDROCODONE-ACETAMINOPHEN 5-325 MG PO TABS
1.0000 | ORAL_TABLET | Freq: Four times a day (QID) | ORAL | 0 refills | Status: DC | PRN
Start: 2019-12-07 — End: 2021-10-01

## 2019-12-07 NOTE — Progress Notes (Signed)
The patient comes in today almost 2 weeks status post a right total hip arthroplasty.  His postoperative course was actually complicated by atrial flutter.  He had a cardioversion soon after surgery as an outpatient.  He is doing well from a hip standpoint.  On examination of his right hip I did remove the staples.  There was a moderate seroma and I was able to aspirate a large seroma from his right hip.  This measured almost 150 cc of fluid.  This gave him good relief.  There is bruising around his hip from him being on Eliquis as a blood thinner.  His leg lengths are equal.  He does report good progress from therapy standpoint.  All question concerns were answered and addressed.  We will see him back in 4 weeks with a repeat exam but no x-rays are needed.  If he does have a significant reaccumulation of his seroma he will call to be seen earlier.

## 2019-12-07 NOTE — Telephone Encounter (Signed)
Patient called. Says Dr. [name] was going to send a RX for some pain medication. He went to pick it up and they don't have it. Would like a RX sent in to his pharmacy. His call back number is [phone]

## 2019-12-07 NOTE — Telephone Encounter (Signed)
Please advise. Thanks.  

## 2019-12-20 NOTE — Progress Notes (Signed)
Cardiology Office Note  Date:  12/21/2019   ID:  [name], DOB 01-31-1961, MRN [id number]  PCP:  [name], MD  Cardiologist:  Dr. Marlou Porch   _____________  2 week follow-up s/p TEE/DCCV  _____________   History of Present Illness: [name] is a 59 y.o. male with pmh of perioperative aflutter in the setting of Right hip replacement s/p cardioversion who presents for follow-up. He is on Eliquis for anticoagulation. Patient had underwent successful TEE/DCCV 12/05/19 and Echo TEE showed LVEF 55-60%, mild LVH, trivial MR, trivial AI, small patent foramen ovale with L>R shunting.   Today, he is doing well. EKG shows NSR with rate in the 60s.  He is recovering from right hip replacement. Finished PT and is doing activity at home. He denies chest pain or shortness of breath. He is taking Eliquis and denies bleeding issues. He is wondering about coming off of this. Not on daily BB or CCB for rate control, he has cardizem to take as needed, however has not needed. It. He denies symptoms of palpitations, orthopnea, PND, lower extremity edema, claudication, dizziness, presyncope, syncope, bleeding, or neurologic sequela. The patient is tolerating medications without difficulties and is otherwise without complaint today.   _____________   Past Medical History:  Diagnosis Date  . Arthritis    back  . History of kidney stones   . LTBI (latent tuberculosis infection)    treated (latent) with rifampin, tested positive 30 years ago  . Sleep apnea    Past Surgical History:  Procedure Laterality Date  . BUBBLE STUDY  12/05/2019   Procedure: BUBBLE STUDY;  Surgeon: Pixie Casino, MD;  Location: [place];  Service: Cardiovascular;;  . CARDIOVERSION N/A 12/05/2019   Procedure: CARDIOVERSION;  Surgeon: Pixie Casino, MD;  Location: [facility] ENDOSCOPY;  Service: Cardiovascular;  Laterality: N/A;  . COLONOSCOPY    . HEMANGIOMA EXCISION     Dr. [name]  . LAMINECTOMY AND  MICRODISCECTOMY LUMBAR SPINE  1999   L5-S1  . LITHOTRIPSY    . ORIF SHOULDER FRACTURE Right 11/29/2018   Procedure: RIGHT OPEN REDUCTION INTERNAL FIXATION (ORIF) SHOULDER FRACTURE;  Surgeon: [name], MD;  Location: [place];  Service: Orthopedics;  Laterality: Right;  . TEE WITHOUT CARDIOVERSION N/A 12/05/2019   Procedure: TRANSESOPHAGEAL ECHOCARDIOGRAM (TEE);  Surgeon: Pixie Casino, MD;  Location: [facility] ENDOSCOPY;  Service: Cardiovascular;  Laterality: N/A;  . TOTAL HIP ARTHROPLASTY Right 11/24/2019   Procedure: RIGHT TOTAL HIP ARTHROPLASTY ANTERIOR APPROACH;  Surgeon: Mcarthur Rossetti, MD;  Location: [facility];  Service: Orthopedics;  Laterality: Right;   _____________  Current Outpatient Medications  Medication Sig Dispense Refill  . acetaminophen (TYLENOL) 500 MG tablet Take 1,000 mg by mouth in the morning, at noon, and at bedtime.    Marland Kitchen apixaban (ELIQUIS) 5 MG TABS tablet Take 1 tablet (5 mg total) by mouth 2 (two) times daily. 60 tablet 11  . diltiazem (CARDIZEM) 60 MG tablet Take 1 tablet (60 mg total) by mouth 4 (four) times daily as needed (rapid heart rate). 30 tablet 11  . HYDROcodone-acetaminophen (NORCO/VICODIN) 5-325 MG tablet Take 1-2 tablets by mouth every 6 (six) hours as needed for moderate pain. 30 tablet 0  . loratadine (CLARITIN) 10 MG tablet Take 10 mg by mouth daily as needed (allergies.).     Marland Kitchen meloxicam (MOBIC) 15 MG tablet Take 15 mg by mouth daily as needed for pain.     . methocarbamol (ROBAXIN) 500 MG tablet Take  1 tablet (500 mg total) by mouth every 6 (six) hours as needed. (Patient taking differently: Take 500 mg by mouth in the morning, at noon, and at bedtime. ) 40 tablet 1  . ondansetron (ZOFRAN ODT) 4 MG disintegrating tablet Take 1 tablet (4 mg total) by mouth every 8 (eight) hours as needed for nausea or vomiting. 20 tablet 0  . oxyCODONE (ROXICODONE) 5 MG immediate release tablet Take 1-2 tablets (5-10 mg total) by mouth every 6 (six) hours as  needed for severe pain. 30 tablet 0  . psyllium (METAMUCIL) 28 % packet Take 1 packet by mouth 2 (two) times daily.    Marland Kitchen triamcinolone (NASACORT ALLERGY 24HR) 55 MCG/ACT AERO nasal inhaler Place 2 sprays into the nose daily as needed (allergies).    . zolpidem (AMBIEN) 10 MG tablet Take 10 mg by mouth at bedtime as needed for sleep.      No current facility-administered medications for this visit.   _____________   Allergies:   Patient has no known allergies.  _____________   Social History:  The patient  reports that he has been smoking cigars. He has never used smokeless tobacco. He reports current alcohol use of about 2.0 standard drinks of alcohol per week. He reports that he does not use drugs.  _____________   Family History:  The patient's family history includes Heart failure in his father; Other in his mother; Renal Disease in his father; Rheum arthritis in his mother.  _____________   ROS:  Please see the history of present illness.    All other systems are reviewed and negative.  _____________   PHYSICAL EXAM: VS:  BP 136/78   Pulse 68   Ht 5\' 10"  (1.778 m)   Wt 222 lb (100.7 kg)   SpO2 96%   BMI 31.85 kg/m  , BMI Body mass index is 31.85 kg/m. GEN: Well nourished, well developed, in no acute distress  HEENT: normal  Neck: no JVD, carotid bruits, or masses Cardiac: RRR; no murmurs, rubs, or gallops. No clubbing, cyanosis, edema.  Radials/DP/PT 2+ and equal bilaterally.  Respiratory:  clear to auscultation bilaterally, normal work of breathing GI: soft, nontender, nondistended, + BS MS: no deformity or atrophy  Skin: warm and dry, no rash Neuro:  Strength and sensation are intact Psych: euthymic mood, full affect _____________  EKG:   The ekg ordered today shows NSR, 68 bpm, nonspecific T wave changes  Recent Labs: 11/29/2019: BUN 18; Creatinine, Ser 0.93; Hemoglobin 13.9; Platelets 301; Potassium 4.1; Sodium 140  No results found for requested labs within  last 8760 hours.  CrCl cannot be calculated (Patient's most recent lab result is older than the maximum 21 days allowed.).  Wt Readings from Last 3 Encounters:  12/21/19 222 lb (100.7 kg)  12/05/19 227 lb (103 kg)  11/29/19 228 lb (103.4 kg)     Relevant Studies: Echo TEE 12/05/19  1. Left ventricular ejection fraction, by estimation, is 55 to 60%. The  left ventricle has normal function. There is mild left ventricular  hypertrophy. Left ventricular diastolic function could not be evaluated.  2. Right ventricular systolic function is normal. The right ventricular  size is normal.  3. No left atrial/left atrial appendage thrombus was detected.  4. The mitral valve is grossly normal. Trivial mitral valve  regurgitation.  5. The aortic valve is tricuspid. Aortic valve regurgitation is trivial.  6. Evidence of atrial level shunting detected by color flow Doppler.  Agitated saline contrast bubble study  was positive with shunting observed  within 3-6 cardiac cycles suggestive of interatrial shunt. There is a  small patent foramen ovale with  predominantly left to right shunting across the atrial septum.   Conclusion(s)/Recommendation(s): Findings are concerning for an  interatrial shunt as detailed above. No LA/LAA thrombus identified.  Successful cardioversion performed with restoration of normal sinus  rhythm.   _____________   ASSESSMENT AND PLAN:  Paroxysmal Aflutter Found to be in rate controlled Aflutter in the perioperative period for right hip replacement. He was asymptomatic and given Cardizem 60mg  to use as needed. S/p successful TEE/DCCV. Today he is in SR, rate in the 60s. CHADSVASC essentially 0, so can technically come off after 4 weeks post cardioversion however will ask Dr. Marlou Porch duration of Eliquis. Continue Eliquis for now. He denies bleeding issues. He was not started on daily rate controlling medication due to possible ?bradycardia and rates were controlled in  aflutter. Continue cardizem as needed.   Foramen ovale On Echo TEE showed small patent foramen ovale with L>R shunting. No plans for intervention.   Disposition:   FU with MD/APP in 6 months   Signed, Romelle Muldoon Ninfa Meeker, PA-C 12/21/2019 10:42 AM    _____________ [facility] [address] [place] [address]  [phone] (office) [phone] (fax)

## 2019-12-21 ENCOUNTER — Other Ambulatory Visit: Payer: Self-pay

## 2019-12-21 ENCOUNTER — Encounter: Payer: Self-pay | Admitting: Cardiology

## 2019-12-21 ENCOUNTER — Ambulatory Visit: Payer: No Typology Code available for payment source | Admitting: Medical

## 2019-12-21 VITALS — BP 136/78 | HR 68 | Ht 70.0 in | Wt 222.0 lb

## 2019-12-21 DIAGNOSIS — I4892 Unspecified atrial flutter: Secondary | ICD-10-CM

## 2019-12-21 DIAGNOSIS — Q2112 Patent foramen ovale: Secondary | ICD-10-CM

## 2019-12-21 DIAGNOSIS — Q211 Atrial septal defect: Secondary | ICD-10-CM | POA: Diagnosis not present

## 2019-12-21 NOTE — Patient Instructions (Signed)
Medication Instructions:  NO CHANGES *If you need a refill on your cardiac medications before your next appointment, please call your pharmacy*   Lab Work: NONE If you have labs (blood work) drawn today and your tests are completely normal, you will receive your results only by: Marland Kitchen MyChart Message (if you have MyChart) OR . A paper copy in the mail If you have any lab test that is abnormal or we need to change your treatment, we will call you to review the results.   Testing/Procedures: NONE   Follow-Up: At [facility], you and your health needs are our priority.  As part of our continuing mission to provide you with exceptional heart care, we have created designated Provider Care Teams.  These Care Teams include your primary Cardiologist (physician) and Advanced Practice Providers (APPs -  Physician Assistants and Nurse Practitioners) who all work together to provide you with the care you need, when you need it.  We recommend signing up for the patient portal called "MyChart".  Sign up information is provided on this After Visit Summary.  MyChart is used to connect with patients for Virtual Visits (Telemedicine).  Patients are able to view lab/test results, encounter notes, upcoming appointments, etc.  Non-urgent messages can be sent to your provider as well.   To learn more about what you can do with MyChart, go to [email or web address].    Your next appointment:   6 month(s)  The format for your next appointment:   In Person  Provider:   DR Candee Furbish  Other Instructions

## 2020-01-04 ENCOUNTER — Encounter: Payer: Self-pay | Admitting: Orthopaedic Surgery

## 2020-01-04 ENCOUNTER — Ambulatory Visit (INDEPENDENT_AMBULATORY_CARE_PROVIDER_SITE_OTHER): Payer: No Typology Code available for payment source | Admitting: Orthopaedic Surgery

## 2020-01-04 DIAGNOSIS — Z96641 Presence of right artificial hip joint: Secondary | ICD-10-CM

## 2020-01-04 NOTE — Progress Notes (Signed)
The patient is now 6 weeks status post a right total hip arthroplasty.  He says he is doing well.  He states that he was able to actually put on his shoes easier and could be strenuous today.  He still work on range of motion but he feels like he is making great progress.  He did there with a flutter postoperatively.  He is still on blood thinners for this per cardiology.  From my standpoint he does not need this but from a cardiac standpoint it is important.  He is walking with minimal limp.  He has good range of motion of his right operative hip.  From my standpoint I will see him back in 6 months with a standing AP pelvis and lateral of his right operative hip.  All questions and concerns were answered and addressed.  He understands that there are issues before 6 months from now he needs to let us know.

## 2020-02-02 ENCOUNTER — Telehealth: Payer: Self-pay | Admitting: *Deleted

## 2020-02-02 MED ORDER — ASPIRIN EC 81 MG PO TBEC: 81.0000 mg | DELAYED_RELEASE_TABLET | Freq: Every day | ORAL | 3 refills | Status: DC

## 2020-02-02 NOTE — Telephone Encounter (Signed)
Pt is aware OK to d/c Eliquis and start ASA 81 mg daily

## 2020-02-02 NOTE — Telephone Encounter (Signed)
Follow up:     Patient returning a call back. Patient has call screen for number he do not know.

## 2020-02-02 NOTE — Telephone Encounter (Signed)
Left message for pt to call back to discuss stopping Eliquis 1 month s/p cardioversion and start ASA 81 mg a day.   ----- Message ----- From: Nuala Alpha, LPN Sent: 65/04/5463  10:23 AM EST To: Shellia Cleverly, RN Subject: FW: anticoagulation                           ----- Message ----- From: Antony Madura, PA-C Sent: 01/31/2020   5:42 PM EST To: [facility] Triage Subject: anticoagulation                                Please call patient and tell him since he completed 1 month of Eliquis post cardioversion it is okay for him to stop this. Instruct him to start baby aspirin 81 mg daily.  ----- Message ----- From: Jerline Pain, MD Sent: 12/31/2019   7:21 AM EST To: Cadence Ninfa Meeker, PA-C  OK to come off Eliquis Perhaps should be on ASA 81 with small PFO  Thanks for the update  Elta [place] ----- Message ----- From: Antony Madura, PA-C Sent: 12/21/2019  10:43 AM EST To: Jerline Pain, MD  HI, I saw this patient for post TEE/DCCV. He is in [place] essentially 0 (although BP sometimes borderline) and wondering if patient is okay to come off Eliquis once he has completed 4 weeks post cardioversion. Thanks!

## 2020-04-05 ENCOUNTER — Encounter: Payer: Self-pay | Admitting: Gastroenterology

## 2020-04-24 ENCOUNTER — Ambulatory Visit (INDEPENDENT_AMBULATORY_CARE_PROVIDER_SITE_OTHER): Payer: No Typology Code available for payment source | Admitting: Orthopedic Surgery

## 2020-04-24 ENCOUNTER — Other Ambulatory Visit: Payer: Self-pay

## 2020-04-24 ENCOUNTER — Encounter: Payer: Self-pay | Admitting: Orthopedic Surgery

## 2020-04-24 DIAGNOSIS — S42291A Other displaced fracture of upper end of right humerus, initial encounter for closed fracture: Secondary | ICD-10-CM | POA: Diagnosis not present

## 2020-04-24 NOTE — Progress Notes (Signed)
Office Visit Note   Patient: [name]           Date of Birth: [birth year]           MRN: [id number] Visit Date: 04/24/2020 Requested by: Ginger Organ., MD [address] [place],  [place] [address] PCP: Ginger Organ., MD  Subjective: Chief Complaint  Patient presents with  . Right Shoulder - Follow-up    HPI: [name] is a 60 year old patient with right shoulder open reduction internal fixation 1020.  Been doing reasonably well.  Has a small amount of discomfort at times.  Finished physical therapy.  Takes occasional Tylenol for his symptoms.  CPM machine helped him with his postop recovery.              ROS: All systems reviewed are negative as they relate to the chief complaint within the history of present illness.  Patient denies  fevers or chills.   Assessment & Plan: Visit Diagnoses: No diagnosis found.  Plan: Impression is patient is doing well following right shoulder abduction internal fixation for fracture.  Lacking little bit of external rotation today.  Some loss of forward flexion and abduction which is not unexpected with this severe of a fracture.  Overall he is doing well.  Based on his mild residual symptoms and mild residual loss of range of motion he is rated at 7% permanent partial disability of the right upper extremity.  This is based on page 405 of the AMA guides to the evaluation of permanent impairment.  Follow-Up Instructions: Return if symptoms worsen or fail to improve.   Orders:  No orders of the defined types were placed in this encounter.  No orders of the defined types were placed in this encounter.     Procedures: No procedures performed   Clinical Data: No additional findings.  Objective: Vital Signs: There were no vitals taken for this visit.  Physical Exam:   Constitutional: Patient appears well-developed HEENT:  Head: Normocephalic Eyes:EOM are normal Neck: Normal range of motion Cardiovascular: Normal  rate Pulmonary/chest: Effort normal Neurologic: Patient is alert Skin: Skin is warm Psychiatric: Patient has normal mood and affect    Ortho Exam: Ortho exam demonstrates full active and passive range of motion of the left shoulder.  On the right-hand side he has range of motion of about 30 of external rotation on the right compared to 60 on the left.  Isolated glenohumeral abduction is 90 on the right compared to 105 on the left.  Forward flexion is 180 on the left compared to about 160 on the right.  Rotator cuff strength is excellent.  No coarse grinding or crepitus with active or passive range of motion of the right arm.  Specialty Comments:  No specialty comments available.  Imaging: No results found.   PMFS History: Patient Active Problem List   Diagnosis Date Noted  . Status post total replacement of right hip 12/07/2019  . Typical atrial flutter ([place])   . Unilateral primary osteoarthritis, right hip 11/24/2019  . LTBI (latent tuberculosis infection) 04/21/2017   Past Medical History:  Diagnosis Date  . Arthritis    back  . History of kidney stones   . LTBI (latent tuberculosis infection)    treated (latent) with rifampin, tested positive 30 years ago  . Sleep apnea     Family History  Problem Relation Age of Onset  . Other Mother        latent TB  .  Rheum arthritis Mother   . Heart failure Father   . Renal Disease Father     Past Surgical History:  Procedure Laterality Date  . BUBBLE STUDY  12/05/2019   Procedure: BUBBLE STUDY;  Surgeon: Pixie Casino, MD;  Location: [place];  Service: Cardiovascular;;  . CARDIOVERSION N/A 12/05/2019   Procedure: CARDIOVERSION;  Surgeon: Pixie Casino, MD;  Location: [facility] ENDOSCOPY;  Service: Cardiovascular;  Laterality: N/A;  . COLONOSCOPY    . HEMANGIOMA EXCISION     Dr. [name]  . LAMINECTOMY AND MICRODISCECTOMY LUMBAR SPINE  1999   L5-S1  . LITHOTRIPSY    . ORIF SHOULDER FRACTURE Right 11/29/2018   Procedure:  RIGHT OPEN REDUCTION INTERNAL FIXATION (ORIF) SHOULDER FRACTURE;  Surgeon: [name], MD;  Location: [place];  Service: Orthopedics;  Laterality: Right;  . TEE WITHOUT CARDIOVERSION N/A 12/05/2019   Procedure: TRANSESOPHAGEAL ECHOCARDIOGRAM (TEE);  Surgeon: Pixie Casino, MD;  Location: [facility] ENDOSCOPY;  Service: Cardiovascular;  Laterality: N/A;  . TOTAL HIP ARTHROPLASTY Right 11/24/2019   Procedure: RIGHT TOTAL HIP ARTHROPLASTY ANTERIOR APPROACH;  Surgeon: Mcarthur Rossetti, MD;  Location: [facility];  Service: Orthopedics;  Laterality: Right;   Social History   Occupational History  . Not on file  Tobacco Use  . Smoking status: Current Some Day Smoker    Types: Cigars  . Smokeless tobacco: Never Used  . Tobacco comment: occ  Vaping Use  . Vaping Use: Never used  Substance and Sexual Activity  . Alcohol use: Yes    Alcohol/week: 2.0 standard drinks    Types: 2 Standard drinks or equivalent per week    Comment: occasional  . Drug use: No  . Sexual activity: Not on file

## 2020-06-19 ENCOUNTER — Other Ambulatory Visit: Payer: Self-pay

## 2020-06-19 ENCOUNTER — Ambulatory Visit: Payer: No Typology Code available for payment source | Admitting: Cardiology

## 2020-06-19 ENCOUNTER — Encounter: Payer: Self-pay | Admitting: Cardiology

## 2020-06-19 VITALS — BP 120/70 | HR 67 | Ht 70.0 in | Wt 229.0 lb

## 2020-06-19 DIAGNOSIS — Q2112 Patent foramen ovale: Secondary | ICD-10-CM

## 2020-06-19 DIAGNOSIS — I4892 Unspecified atrial flutter: Secondary | ICD-10-CM

## 2020-06-19 DIAGNOSIS — Q211 Atrial septal defect: Secondary | ICD-10-CM

## 2020-06-19 NOTE — Patient Instructions (Signed)
Medication Instructions:  The current medical regimen is effective;  continue present plan and medications.  *If you need a refill on your cardiac medications before your next appointment, please call your pharmacy*  Follow-Up: At [facility], you and your health needs are our priority.  As part of our continuing mission to provide you with exceptional heart care, we have created designated Provider Care Teams.  These Care Teams include your primary Cardiologist (physician) and Advanced Practice Providers (APPs -  Physician Assistants and Nurse Practitioners) who all work together to provide you with the care you need, when you need it.  We recommend signing up for the patient portal called "MyChart".  Sign up information is provided on this After Visit Summary.  MyChart is used to connect with patients for Virtual Visits (Telemedicine).  Patients are able to view lab/test results, encounter notes, upcoming appointments, etc.  Non-urgent messages can be sent to your provider as well.   To learn more about what you can do with MyChart, go to [email or web address].    Your next appointment:   12 month(s)  The format for your next appointment:   In Person  Provider:   Mark Skains, MD   Thank you for choosing [facility]!!      

## 2020-06-19 NOTE — Progress Notes (Signed)
Cardiology Office Note:    Date:  06/19/2020   ID:  [name], DOB Jul 13, 1960, MRN [id number]  PCP:  Ginger Organ., MD   [facility] Providers Cardiologist:  Candee Furbish, MD     Referring MD: Ginger Organ., MD    History of Present Illness:    [name] is a 60 y.o. male here for follow up for atrial flatter. He previously saw  Cadence Furth PA on 12/21/19 preoperative flutter surrounding a right hip replacement. He had a  TEE/CV on 12/05/19 which  successful. He just returned from [place] this Saturday and on Sunday he developed a "head cold". He reports that he was able to participate in sea diving(12 total) without any exertional shortness of breath, chest pain, tightness, or pressure. He notes feeling a flutter in his heart a couple of times, but nothing concerning. He has been consistently taking Aspirin. He denies having any lightheadedness, PND, or orthopnea.   Past Medical History:  Diagnosis Date  . Arthritis    back  . History of kidney stones   . LTBI (latent tuberculosis infection)    treated (latent) with rifampin, tested positive 30 years ago  . Sleep apnea     Past Surgical History:  Procedure Laterality Date  . BUBBLE STUDY  12/05/2019   Procedure: BUBBLE STUDY;  Surgeon: Pixie Casino, MD;  Location: [place];  Service: Cardiovascular;;  . CARDIOVERSION N/A 12/05/2019   Procedure: CARDIOVERSION;  Surgeon: Pixie Casino, MD;  Location: [facility] END[facility]COPY;  Service: Cardiovascular;  Laterality: N/A;  . COLONOSCOPY    . HEMANGIOMA EXCISION     Dr. [name]  . LAMINECTOMY AND MICRODISCECTOMY LUMBAR SPINE  1999   L5-S1  . LITHOTRIPSY    . ORIF SHOULDER FRACTURE Right 11/29/2018   Procedure: RIGHT OPEN REDUCTION INTERNAL FIXATION (ORIF) SHOULDER FRACTURE;  Surgeon: [name], MD;  Location: [place];  Service: Orthopedics;  Laterality: Right;  . TEE WITHOUT CARDIOVERSION N/A 12/05/2019   Procedure: TRANSESOPHAGEAL ECHOCARDIOGRAM  (TEE);  Surgeon: Pixie Casino, MD;  Location[facility] ENDOSCOPY;  Service: Cardiovascular;  Laterality: N/A;  . TOTAL HIP ARTHROPLASTY Right 11/24/2019   Procedure: RIGHT TOTAL HIP ARTHROPLASTY ANTERIOR APPROACH;  Surgeon: Mcarthur Rossetti, MD;  Location: [facility];  Service: Orthopedics;  Laterality: Right;    Current Medications: Current Meds  Medication Sig  . acetaminophen (TYLENOL) 500 MG tablet Take 1,000 mg by mouth in the morning, at noon, and at bedtime.  Marland Kitchen aspirin EC 81 MG tablet Take 1 tablet (81 mg total) by mouth daily. Swallow whole.  . diltiazem (CARDIZEM) 60 MG tablet Take 1 tablet (60 mg total) by mouth 4 (four) times daily as needed (rapid heart rate).  Marland Kitchen HYDROcodone-acetaminophen (NORCO/VICODIN) 5-325 MG tablet Take 1-2 tablets by mouth every 6 (six) hours as needed for moderate pain.  Marland Kitchen loratadine (CLARITIN) 10 MG tablet Take 10 mg by mouth daily as needed (allergies.).   Marland Kitchen meloxicam (MOBIC) 15 MG tablet Take 15 mg by mouth daily as needed for pain.   . methocarbamol (ROBAXIN) 500 MG tablet Take 1 tablet (500 mg total) by mouth every 6 (six) hours as needed.  . ondansetron (ZOFRAN ODT) 4 MG disintegrating tablet Take 1 tablet (4 mg total) by mouth every 8 (eight) hours as needed for nausea or vomiting.  Marland Kitchen oxyCODONE (ROXICODONE) 5 MG immediate release tablet Take 1-2 tablets (5-10 mg total) by mouth every 6 (six) hours as needed for severe  pain.  . psyllium (METAMUCIL) 28 % packet Take 1 packet by mouth 2 (two) times daily.  Marland Kitchen triamcinolone (NASACORT) 55 MCG/ACT AERO nasal inhaler Place 2 sprays into the nose daily as needed (allergies).  . zolpidem (AMBIEN) 10 MG tablet Take 10 mg by mouth at bedtime as needed for sleep.      Allergies:   Patient has no known allergies.   Social History   Socioeconomic History  . Marital status: Married    Spouse name: Not on file  . Number of children: Not on file  . Years of education: Not on file  . Highest education level: Not  on file  Occupational History  . Not on file  Tobacco Use  . Smoking status: Current Some Day Smoker    Types: Cigars  . Smokeless tobacco: Never Used  . Tobacco comment: occ  Vaping Use  . Vaping Use: Never used  Substance and Sexual Activity  . Alcohol use: Yes    Alcohol/week: 2.0 standard drinks    Types: 2 Standard drinks or equivalent per week    Comment: occasional  . Drug use: No  . Sexual activity: Not on file  Other Topics Concern  . Not on file  Social History Narrative  . Not on file   Social Determinants of Health   Financial Resource Strain: Not on file  Food Insecurity: Not on file  Transportation Needs: Not on file  Physical Activity: Not on file  Stress: Not on file  Social Connections: Not on file     Family History: The patient'sfamily history includes Heart failure in his father; Other in his mother; Renal Disease in his father; Rheum arthritis in his mother.  ROS:   Please see the history of present illness.    All other systems reviewed and are negative.  EKGs/Labs/Other Studies Reviewed:    The following studies were reviewed today: ECHO TEE(12/05/19)  1. Left ventricular ejection fraction, by estimation, is 55 to 60%. The  left ventricle has normal function. There is mild left ventricular  hypertrophy. Left ventricular diastolic function could not be evaluated.  2. Right ventricular systolic function is normal. The right ventricular  size is normal.  3. No left atrial/left atrial appendage thrombus was detected.  4. The mitral valve is grossly normal. Trivial mitral valve  regurgitation.  5. The aortic valve is tricuspid. Aortic valve regurgitation is trivial.  6. Evidence of atrial level shunting detected by color flow Doppler.  Agitated saline contrast bubble study was positive with shunting observed  within 3-6 cardiac cycles suggestive of interatrial shunt. There is a  small patent foramen ovale with  predominantly left to  right shunting across the atrial septum.     Recent Labs: 11/29/2019: BUN 18; Creatinine, Ser 0.93; Hemoglobin 13.9; Platelets 301; Potassium 4.1; Sodium 140  Recent Lipid Panel No results found for: CHOL, TRIG, HDL, CHOLHDL, VLDL, LDLCALC, LDLDIRECT    Physical Exam:    VS:  BP 120/70 (BP Location: Left Arm, Patient Position: Sitting, Cuff Size: Normal)   Pulse 67   Ht 5\' 10"  (1.778 m)   Wt 229 lb (103.9 kg)   SpO2 97%   BMI 32.86 kg/m     Wt Readings from Last 3 Encounters:  06/19/20 229 lb (103.9 kg)  12/21/19 222 lb (100.7 kg)  12/05/19 227 lb (103 kg)     GEN: Well nourished, well developed in no acute distress HEENT: Normal NECK: No JVD; No carotid bruits LYMPHATICS: No lymphadenopathy  CARDIAC: RRR, no murmurs, rubs, gallops RESPIRATORY:  Clear to auscultation without rales, wheezing or rhonchi  ABDOMEN: Soft, non-tender, non-distended MUSCULOSKELETAL:  No edema; No deformity  SKIN: Warm and dry NEUROLOGIC:  Alert and oriented x 3 PSYCHIATRIC:  Normal affect   ASSESSMENT:    1. Atrial flutter, unspecified type ([place])   2. Patent foramen ovale    PLAN:    In order of problems listed above:  Paroxysmal atrial flutter - Noted in the perioperative period.  Successful cardioversion with TEE.  He is not on anticoagulation because his CHA2DS2-VASc is 0.  Has diltiazem 60 mg to use as needed.  Overall doing well.  PFO - Small PFO noted on TEE.  No need for intervention.  Continue aspirin 81 mg.  Should be of no clinical significance.  He has not had any adverse effects from diving.  Understands precautions.  1 year follow-up      Medication Adjustments/Labs and Tests Ordered: Current medicines are reviewed at length with the patient today.  Concerns regarding medicines are outlined above.  No orders of the defined types were placed in this encounter.  No orders of the defined types were placed in this encounter.   Patient Instructions  Medication  Instructions:  The current medical regimen is effective;  continue present plan and medications.  *If you need a refill on your cardiac medications before your next appointment, please call your pharmacy*  Follow-Up: At [facility], you and your health needs are our priority.  As part of our continuing mission to provide you with exceptional heart care, we have created designated Provider Care Teams.  These Care Teams include your primary Cardiologist (physician) and Advanced Practice Providers (APPs -  Physician Assistants and Nurse Practitioners) who all work together to provide you with the care you need, when you need it.  We recommend signing up for the patient portal called "MyChart".  Sign up information is provided on this After Visit Summary.  MyChart is used to connect with patients for Virtual Visits (Telemedicine).  Patients are able to view lab/test results, encounter notes, upcoming appointments, etc.  Non-urgent messages can be sent to your provider as well.   To learn more about what you can do with MyChart, go to [email or web address].    Your next appointment:   12 month(s)  The format for your next appointment:   In Person  Provider:   Candee Furbish, MD   Thank you for choosing [facility]!!          I,Alexis Bryant,acting as a scribe for Candee Furbish, MD.,have documented all relevant documentation on the behalf of Candee Furbish, MD,as directed by  Candee Furbish, MD while in the presence of Candee Furbish, MD.  I, Candee Furbish, MD, have reviewed all documentation for this visit. The documentation on 06/19/20 for the exam, diagnosis, procedures, and orders are all accurate and complete.  Signed, Candee Furbish, MD  06/19/2020 3:06 PM    [facility]

## 2020-07-03 ENCOUNTER — Ambulatory Visit (INDEPENDENT_AMBULATORY_CARE_PROVIDER_SITE_OTHER): Payer: No Typology Code available for payment source

## 2020-07-03 ENCOUNTER — Encounter: Payer: Self-pay | Admitting: Physician Assistant

## 2020-07-03 ENCOUNTER — Ambulatory Visit: Payer: No Typology Code available for payment source | Admitting: Physician Assistant

## 2020-07-03 DIAGNOSIS — Z96641 Presence of right artificial hip joint: Secondary | ICD-10-CM | POA: Diagnosis not present

## 2020-07-03 NOTE — Progress Notes (Signed)
Office Visit Note   Patient: [name]           Date of Birth: [birth year]           MRN: [id number] Visit Date: 07/03/2020              Requested by: Ginger Organ., MD [address] [place],  [place] [address] PCP: Ginger Organ., MD   Assessment & Plan: Visit Diagnoses:  1. Status post total replacement of right hip     Plan: We will have him return in 6 months for AP pelvis.  He is otherwise activities as tolerated.  He will follow-up sooner if there is any questions concerns.  Follow-Up Instructions: Return in about 6 months (around 01/03/2021).   Orders:  Orders Placed This Encounter  Procedures  . XR HIP UNILAT W OR W/O PELVIS 2-3 VIEWS RIGHT   No orders of the defined types were placed in this encounter.     Procedures: No procedures performed   Clinical Data: No additional findings.   Subjective: Chief Complaint  Patient presents with  . Right Hip - Routine Post Op    HPI  Patient returns today status post right total hip arthroplasty 11/24/2019 for follow-up.  Just has some discomfort in the right hip whenever he flexes beyond 90 degrees at the waist.  Otherwise the hip is doing well.  He states his incision is healed well.  Review of Systems See HPI otherwise negative or noncontributory.  Objective: Vital Signs: There were no vitals taken for this visit.  Physical Exam General: Well-developed well-nourished male no acute distress.  Ambulates without any assistive device Psych: Alert and oriented x3 Ortho Exam Right hip excellent range of motion without pain.  Right calf supple nontender.  Dorsiflexion plantarflexion right ankle intact. Specialty Comments:  No specialty comments available.  Imaging: XR HIP UNILAT W OR W/O PELVIS 2-3 VIEWS RIGHT  Result Date: 07/03/2020 AP pelvis lateral view of the right hip: Both hips well located.  Status post right total hip arthroplasty well-seated components without complication.   Heterotopic bone seen in the right hip region.  No other bony abnormality seen.     PMFS History: Patient Active Problem List   Diagnosis Date Noted  . Status post total replacement of right hip 12/07/2019  . Typical atrial flutter ([place])   . Unilateral primary osteoarthritis, right hip 11/24/2019  . LTBI (latent tuberculosis infection) 04/21/2017   Past Medical History:  Diagnosis Date  . Arthritis    back  . History of kidney stones   . LTBI (latent tuberculosis infection)    treated (latent) with rifampin, tested positive 30 years ago  . Sleep apnea     Family History  Problem Relation Age of Onset  . Other Mother        latent TB  . Rheum arthritis Mother   . Heart failure Father   . Renal Disease Father     Past Surgical History:  Procedure Laterality Date  . BUBBLE STUDY  12/05/2019   Procedure: BUBBLE STUDY;  Surgeon: Pixie Casino, MD;  Location: [place];  Service: Cardiovascular;;  . CARDIOVERSION N/A 12/05/2019   Procedure: CARDIOVERSION;  Surgeon: Pixie Casino, MD;  Location: [facility] ENDOSCOPY;  Service: Cardiovascular;  Laterality: N/A;  . COLONOSCOPY    . HEMANGIOMA EXCISION     Dr. [name]  . LAMINECTOMY AND MICRODISCECTOMY LUMBAR SPINE  1999   L5-S1  . LITHOTRIPSY    .  ORIF SHOULDER FRACTURE Right 11/29/2018   Procedure: RIGHT OPEN REDUCTION INTERNAL FIXATION (ORIF) SHOULDER FRACTURE;  Surgeon: [name], MD;  Location: [place];  Service: Orthopedics;  Laterality: Right;  . TEE WITHOUT CARDIOVERSION N/A 12/05/2019   Procedure: TRANSESOPHAGEAL ECHOCARDIOGRAM (TEE);  Surgeon: Pixie Casino, MD;  Location: [facility] ENDOSCOPY;  Service: Cardiovascular;  Laterality: N/A;  . TOTAL HIP ARTHROPLASTY Right 11/24/2019   Procedure: RIGHT TOTAL HIP ARTHROPLASTY ANTERIOR APPROACH;  Surgeon: Mcarthur Rossetti, MD;  Location: [facility];  Service: Orthopedics;  Laterality: Right;   Social History   Occupational History  . Not on file  Tobacco Use  .  Smoking status: Current Some Day Smoker    Types: Cigars  . Smokeless tobacco: Never Used  . Tobacco comment: occ  Vaping Use  . Vaping Use: Never used  Substance and Sexual Activity  . Alcohol use: Yes    Alcohol/week: 2.0 standard drinks    Types: 2 Standard drinks or equivalent per week    Comment: occasional  . Drug use: No  . Sexual activity: Not on file

## 2020-08-12 ENCOUNTER — Other Ambulatory Visit: Payer: Self-pay

## 2020-08-12 ENCOUNTER — Ambulatory Visit (AMBULATORY_SURGERY_CENTER): Payer: No Typology Code available for payment source

## 2020-08-12 VITALS — Ht 70.0 in | Wt 220.0 lb

## 2020-08-12 DIAGNOSIS — Z1211 Encounter for screening for malignant neoplasm of colon: Secondary | ICD-10-CM

## 2020-08-12 NOTE — Progress Notes (Signed)
Pt verified name, DOB, address and insurance during PV today.   Pt mailed instruction packet to included copy of consent form to read and not return, and instructions.  PV completed over the phone. Pt encouraged to call with questions or issues.   No allergies to soy or egg Pt is not on blood thinners or diet pills Denies issues with sedation/intubation Denies atrial flutter/fib Denies constipation   Pt is aware of Covid safety and care partner requirements.

## 2020-08-26 ENCOUNTER — Other Ambulatory Visit: Payer: Self-pay

## 2020-08-26 ENCOUNTER — Ambulatory Visit (AMBULATORY_SURGERY_CENTER): Payer: No Typology Code available for payment source | Admitting: Gastroenterology

## 2020-08-26 ENCOUNTER — Encounter: Payer: Self-pay | Admitting: Gastroenterology

## 2020-08-26 VITALS — BP 146/102 | HR 50 | Temp 97.3°F | Resp 19 | Ht 70.0 in | Wt 220.0 lb

## 2020-08-26 DIAGNOSIS — Z1211 Encounter for screening for malignant neoplasm of colon: Secondary | ICD-10-CM | POA: Diagnosis present

## 2020-08-26 DIAGNOSIS — D122 Benign neoplasm of ascending colon: Secondary | ICD-10-CM

## 2020-08-26 MED ORDER — SODIUM CHLORIDE 0.9 % IV SOLN: 500.0000 mL | Freq: Once | INTRAVENOUS | Status: DC

## 2020-08-26 NOTE — Progress Notes (Signed)
Pt's states no medical or surgical changes since previsit or office visit. 

## 2020-08-26 NOTE — Progress Notes (Signed)
Report given to PACU, vss 

## 2020-08-26 NOTE — Op Note (Signed)
[name] Procedure Date: 08/26/2020 9:59 AM MRN: [id number] Endoscopist: Remo Lipps P. Havery Moros , MD Age: 60 Referring MD:  Date of Birth: October 13, 1960 Gender: Male Account #: [id number] Procedure:                Colonoscopy Indications:              Screening for colorectal malignant neoplasm Medicines:                Monitored Anesthesia Care Procedure:                Pre-Anesthesia Assessment:                           - Prior to the procedure, a History and Physical                            was performed, and patient medications and                            allergies were reviewed. The patient's tolerance of                            previous anesthesia was also reviewed. The risks                            and benefits of the procedure and the sedation                            options and risks were discussed with the patient.                            All questions were answered, and informed consent                            was obtained. Prior Anticoagulants: The patient has                            taken no previous anticoagulant or antiplatelet                            agents. ASA Grade Assessment: III - A patient with                            severe systemic disease. After reviewing the risks                            and benefits, the patient was deemed in                            satisfactory condition to undergo the procedure.                           After obtaining informed consent, the colonoscope  was passed under direct vision. Throughout the                            procedure, the patient's blood pressure, pulse, and                            oxygen saturations were monitored continuously. The                            CF [id number] #[id number] was introduced through the anus                            and advanced to the the cecum, identified by                            appendiceal  orifice and ileocecal valve. The                            colonoscopy was performed without difficulty. The                            patient tolerated the procedure well. The quality                            of the bowel preparation was good. The ileocecal                            valve, appendiceal orifice, and rectum were                            photographed. Scope In: 10:12:45 AM Scope Out: 10:30:02 AM Scope Withdrawal Time: 0 hours 15 minutes 5 seconds  Total Procedure Duration: 0 hours 17 minutes 17 seconds  Findings:                 The perianal and digital rectal examinations were                            normal.                           A single medium-sized angiodysplastic lesion was                            found in the ascending colon.                           Two sessile polyps were found in the ascending                            colon. The polyps were 3 to 4 mm in size. These                            polyps were removed with a cold snare. Resection  and retrieval were complete.                           Multiple medium-mouthed diverticula were found in                            the sigmoid colon.                           The exam was otherwise without abnormality. Complications:            No immediate complications. Estimated blood loss:                            Minimal. Estimated Blood Loss:     Estimated blood loss was minimal. Impression:               - A single colonic angiodysplastic lesion.                           - Two 3 to 4 mm polyps in the ascending colon,                            removed with a cold snare. Resected and retrieved.                           - Diverticulosis in the sigmoid colon.                           - The examination was otherwise normal. Recommendation:           - Patient has a contact number available for                            emergencies. The signs and symptoms of potential                             delayed complications were discussed with the                            patient. Return to normal activities tomorrow.                            Written discharge instructions were provided to the                            patient.                           - Resume previous diet.                           - Continue present medications.                           - Await pathology results. Remo Lipps P. Zakkiyya Barno, MD 08/26/2020 10:35:01 AM This report has been signed electronically.

## 2020-08-26 NOTE — Patient Instructions (Addendum)
Handouts were given to your care partner on polyps and diverticulosis. You may resume your current medications today. Await biopsy results.  May take 1-3 weeks to receive pathology results. Please call if any questions or concerns.      YOU HAD AN ENDOSCOPIC PROCEDURE TODAY AT [facility]:   Refer to the procedure report that was given to you for any specific questions about what was found during the examination.  If the procedure report does not answer your questions, please call your gastroenterologist to clarify.  If you requested that your care partner not be given the details of your procedure findings, then the procedure report has been included in a sealed envelope for you to review at your convenience later.  YOU SHOULD EXPECT: Some feelings of bloating in the abdomen. Passage of more gas than usual.  Walking can help get rid of the air that was put into your GI tract during the procedure and reduce the bloating. If you had a lower endoscopy (such as a colonoscopy or flexible sigmoidoscopy) you may notice spotting of blood in your stool or on the toilet paper. If you underwent a bowel prep for your procedure, you may not have a normal bowel movement for a few days.  Please Note:  You might notice some irritation and congestion in your nose or some drainage.  This is from the oxygen used during your procedure.  There is no need for concern and it should clear up in a day or so.  SYMPTOMS TO REPORT IMMEDIATELY:  Following lower endoscopy (colonoscopy or flexible sigmoidoscopy):  Excessive amounts of blood in the stool  Significant tenderness or worsening of abdominal pains  Swelling of the abdomen that is new, acute  Fever of 100F or higher   For urgent or emergent issues, a gastroenterologist can be reached at any hour by calling [phone]. Do not use MyChart messaging for urgent concerns.    DIET:  We do recommend a small meal at first, but then you may  proceed to your regular diet.  Drink plenty of fluids but you should avoid alcoholic beverages for 24 hours.  ACTIVITY:  You should plan to take it easy for the rest of today and you should NOT DRIVE or use heavy machinery until tomorrow (because of the sedation medicines used during the test).    FOLLOW UP: Our staff will call the number listed on your records 48-72 hours following your procedure to check on you and address any questions or concerns that you may have regarding the information given to you following your procedure. If we do not reach you, we will leave a message.  We will attempt to reach you two times.  During this call, we will ask if you have developed any symptoms of COVID 19. If you develop any symptoms (ie: fever, flu-like symptoms, shortness of breath, cough etc.) before then, please call [phone].  If you test positive for Covid 19 in the 2 weeks post procedure, please call and report this information to [place].    If any biopsies were taken you will be contacted by phone or by letter within the next 1-3 weeks.  Please call us at [phone] if you have not heard about the biopsies in 3 weeks.    SIGNATURES/CONFIDENTIALITY: You and/or your care partner have signed paperwork which will be entered into your electronic medical record.  These signatures attest to the fact that that the information above on your After Visit  Summary has been reviewed and is understood.  Full responsibility of the confidentiality of this discharge information lies with you and/or your care-partner.  

## 2020-08-26 NOTE — Progress Notes (Signed)
Per Lafe Garin, CRNA pt was in sr or sinus brady throughout the colonoscopy.  No problems noted in the recovery room. maw

## 2020-08-26 NOTE — Progress Notes (Signed)
C.W. vital signs. 

## 2020-08-28 ENCOUNTER — Telehealth: Payer: Self-pay

## 2020-08-28 NOTE — Telephone Encounter (Signed)
Left voice message.

## 2021-01-01 ENCOUNTER — Other Ambulatory Visit: Payer: Self-pay

## 2021-01-01 ENCOUNTER — Encounter: Payer: Self-pay | Admitting: Orthopaedic Surgery

## 2021-01-01 ENCOUNTER — Ambulatory Visit: Payer: Self-pay

## 2021-01-01 ENCOUNTER — Ambulatory Visit: Payer: No Typology Code available for payment source | Admitting: Orthopaedic Surgery

## 2021-01-01 ENCOUNTER — Encounter: Payer: Self-pay | Admitting: Orthopedic Surgery

## 2021-01-01 DIAGNOSIS — Z96641 Presence of right artificial hip joint: Secondary | ICD-10-CM

## 2021-01-01 NOTE — Progress Notes (Signed)
Office Visit Note   Patient: [name]           Date of Birth: 04-20-1960           MRN: [id number] Visit Date: 01/01/2021              Requested by: Ginger Organ., MD [address] [place],  [place] [address] PCP: Ginger Organ., MD   Assessment & Plan: Visit Diagnoses:  1. History of right hip replacement     Plan:  He will follow-up on an as-needed basis.  Recommended IT band stretching exercises which are discussed with him.  Questions encouraged and answered by Dr. [name] and myself.  Follow-Up Instructions: Return if symptoms worsen or fail to improve.   Orders:  Orders Placed This Encounter  Procedures   XR Pelvis 1-2 Views   No orders of the defined types were placed in this encounter.     Procedures: No procedures performed   Clinical Data: No additional findings.   Subjective: Chief Complaint  Patient presents with   Right Hip - Follow-up    HPI [name] is now a year and 1 month status post right total hip arthroplasty.  He states right hip is doing well.  He has some achiness lateral aspect of the hip at times he feels this is due to overuse.  He otherwise is doing well.  Review of Systems  Constitutional:  Negative for chills and fever.    Objective: Vital Signs: There were no vitals taken for this visit.  Physical Exam Constitutional:      Appearance: He is not ill-appearing or diaphoretic.  Neurological:     Mental Status: He is alert.    Ortho Exam Right hip full range of motion without pain.  Nontender over the trochanteric region.  Ambulates without any assistive device. Specialty Comments:  No specialty comments available.  Imaging: XR Pelvis 1-2 Views  Result Date: 01/01/2021 AP pelvis: Bilateral hips well located.  Left hip well preserved.  Right total hip arthroplasty components well-seated.  No acute fractures or bony abnormalities.    PMFS History: Patient Active Problem List   Diagnosis Date  Noted   Status post total replacement of right hip 12/07/2019   Typical atrial flutter (HCC)    Unilateral primary osteoarthritis, right hip 11/24/2019   LTBI (latent tuberculosis infection) 04/21/2017   Past Medical History:  Diagnosis Date   Arthritis    back   Atrial flutter ([place])    Has medication for this as needed.   History of kidney stones    LTBI (latent tuberculosis infection)    treated (latent) with rifampin, tested positive 30 years ago   Sleep apnea     Family History  Problem Relation Age of Onset   Colon polyps Mother    Other Mother        latent TB   Rheum arthritis Mother    Heart failure Father    Renal Disease Father    Colon cancer Neg Hx    Esophageal cancer Neg Hx    Stomach cancer Neg Hx    Rectal cancer Neg Hx     Past Surgical History:  Procedure Laterality Date   BUBBLE STUDY  12/05/2019   Procedure: BUBBLE STUDY;  Surgeon: Pixie Casino, MD;  Location: [place];  Service: Cardiovascular;;   CARDIOVERSION N/A 12/05/2019   Procedure: CARDIOVERSION;  Surgeon: Pixie Casino, MD;  Location: [place];  Service: Cardiovascular;  Laterality: N/A;   COLONOSCOPY     HEMANGIOMA EXCISION     Dr. [name]   LAMINECTOMY AND MICRODISCECTOMY LUMBAR SPINE  1999   L5-S1   LITHOTRIPSY     ORIF SHOULDER FRACTURE Right 11/29/2018   Procedure: RIGHT OPEN REDUCTION INTERNAL FIXATION (ORIF) SHOULDER FRACTURE;  Surgeon: [name], MD;  Location: [place];  Service: Orthopedics;  Laterality: Right;   TEE WITHOUT CARDIOVERSION N/A 12/05/2019   Procedure: TRANSESOPHAGEAL ECHOCARDIOGRAM (TEE);  Surgeon: Pixie Casino, MD;  Location: [facility];  Service: Cardiovascular;  Laterality: N/A;   TOTAL HIP ARTHROPLASTY Right 11/24/2019   Procedure: RIGHT TOTAL HIP ARTHROPLASTY ANTERIOR APPROACH;  Surgeon: Mcarthur Rossetti, MD;  Location: [facility];  Service: Orthopedics;  Laterality: Right;   Social History   Occupational History   Not on file   Tobacco Use   Smoking status: Some Days    Types: Cigars   Smokeless tobacco: Former   Tobacco comments:    occ  Vaping Use   Vaping Use: Never used  Substance and Sexual Activity   Alcohol use: Yes    Alcohol/week: 2.0 standard drinks    Types: 2 Standard drinks or equivalent per week    Comment: occasional   Drug use: No   Sexual activity: Not on file

## 2021-02-14 ENCOUNTER — Other Ambulatory Visit (INDEPENDENT_AMBULATORY_CARE_PROVIDER_SITE_OTHER): Payer: No Typology Code available for payment source | Admitting: General Surgery

## 2021-02-14 DIAGNOSIS — Z09 Encounter for follow-up examination after completed treatment for conditions other than malignant neoplasm: Secondary | ICD-10-CM

## 2021-02-14 MED ORDER — SULFAMETHOXAZOLE-TRIMETHOPRIM 800-160 MG PO TABS: 1.0000 | ORAL_TABLET | Freq: Two times a day (BID) | ORAL | 1 refills | Status: DC

## 2021-02-14 NOTE — Progress Notes (Signed)
Cellulitis left buttock

## 2021-04-21 ENCOUNTER — Encounter: Payer: Self-pay | Admitting: Orthopaedic Surgery

## 2021-04-21 ENCOUNTER — Ambulatory Visit (INDEPENDENT_AMBULATORY_CARE_PROVIDER_SITE_OTHER): Payer: No Typology Code available for payment source

## 2021-04-21 ENCOUNTER — Ambulatory Visit: Payer: No Typology Code available for payment source | Admitting: Orthopaedic Surgery

## 2021-04-21 ENCOUNTER — Other Ambulatory Visit: Payer: Self-pay

## 2021-04-21 DIAGNOSIS — M25551 Pain in right hip: Secondary | ICD-10-CM | POA: Diagnosis not present

## 2021-04-21 DIAGNOSIS — Z96641 Presence of right artificial hip joint: Secondary | ICD-10-CM

## 2021-04-21 NOTE — Progress Notes (Signed)
The patient is well-known to me.  He has a history of a right total hip arthroplasty performed through a direct anterior approach in October 2021.  He was doing well until September when he was fishing and he can do really hard on the right leg and he had a jolting type of pain.  Since then he does have pain when he first gets up to weight-bear and seems to be in the proximal anterior thigh area and the hip flexors as well as the groin area.  He says that he does not have any back pain or any numbness and tingling.  He says when he gets up from a sitting position it is almost like he feels a catch when he moves a little bit.  He gets a spasm in his thigh.  We actually x-rayed his right hip in November of this past year and it was normal appearing in terms of the replacement. ? ?His right hip moves smoothly and fluidly.  I do see the pain he has when he first gets up.  He has a negative straight leg raise.  This is on the right side.  He has no back issues. ? ?New AP pelvis and lateral of the right hip today shows a well-seated total hip arthroplasty with no complicating features. ? ?At this point I would like to obtain an MRI of the right hip to rule out injury to the iliopsoas tendon or even any type of stress response from the femur itself that could suggest loosening or other pathology as a relates to the hip replacement itself.  He has been completely asymptomatic until this injury in September and now its been 6 months of failure of conservative treatment.  He knows to schedule return follow-up once we have the MRI of his right hip.  All questions and concerns were answered and addressed. ?

## 2021-04-23 ENCOUNTER — Ambulatory Visit
Admission: RE | Admit: 2021-04-23 | Discharge: 2021-04-23 | Disposition: A | Discharge status: Home / Self Care | Payer: No Typology Code available for payment source | Source: Ambulatory Visit | Attending: Orthopaedic Surgery | Admitting: Orthopaedic Surgery

## 2021-04-23 DIAGNOSIS — M25551 Pain in right hip: Secondary | ICD-10-CM

## 2021-04-23 DIAGNOSIS — Z96641 Presence of right artificial hip joint: Secondary | ICD-10-CM

## 2021-05-12 ENCOUNTER — Encounter: Payer: Self-pay | Admitting: Orthopaedic Surgery

## 2021-05-12 ENCOUNTER — Ambulatory Visit (INDEPENDENT_AMBULATORY_CARE_PROVIDER_SITE_OTHER): Payer: No Typology Code available for payment source | Admitting: Orthopaedic Surgery

## 2021-05-12 DIAGNOSIS — Z96641 Presence of right artificial hip joint: Secondary | ICD-10-CM

## 2021-05-12 DIAGNOSIS — M25551 Pain in right hip: Secondary | ICD-10-CM | POA: Diagnosis not present

## 2021-05-12 NOTE — Progress Notes (Signed)
The patient comes in to go over the MRI of his right hip.  We replaced his right hip in October 2021 and he had done well until September of this past year when he was fishing and he landed awkwardly on his right hip.  He has had some anterior thigh pain since then.  He does feel like it is getting slightly better but overall he still having some of the discomfort.  It improves with mobilization. ? ?The MRIs reviewed with him and does not show any complicating features of the arthroplasty itself.  There is no muscle atrophy or muscle or tendon tears.  There is no effusion or fluid collections that are worrisome.  There is no edema in the bone.  There is no evidence of loosening. ? ?Hopefully this will continue does subside with time.  He is feeling better and it was good to the MRI shows no worrisome features of the right hip replacement.  He does have some moderate arthritis of the left hip but he is asymptomatic on my exam today with a left hip and his right operative hip moves smoothly. ? ?This point follow-up can be as needed.  All questions and concerns were answered and addressed. ?

## 2021-10-01 ENCOUNTER — Ambulatory Visit: Payer: No Typology Code available for payment source | Admitting: Cardiology

## 2021-10-01 ENCOUNTER — Encounter: Payer: Self-pay | Admitting: Cardiology

## 2021-10-01 VITALS — BP 120/80 | HR 49 | Ht 70.0 in | Wt 228.0 lb

## 2021-10-01 DIAGNOSIS — I483 Typical atrial flutter: Secondary | ICD-10-CM

## 2021-10-01 DIAGNOSIS — Q2112 Patent foramen ovale: Secondary | ICD-10-CM | POA: Diagnosis not present

## 2021-10-01 DIAGNOSIS — I4892 Unspecified atrial flutter: Secondary | ICD-10-CM | POA: Diagnosis not present

## 2021-10-01 NOTE — Progress Notes (Signed)
Cardiology Office Note:    Date:  10/01/2021   ID:  TUAN TIPPIN, DOB [birth year], MRN [id number]  PCP:  Ginger Organ., MD   [facility] Providers Cardiologist:  Candee Furbish, MD     Referring MD: Ginger Organ., MD    History of Present Illness:    Taisei IZACK HOOGLAND is a 61 y.o. male here for follow up atrial flutter, PFO. 12/21/19 preoperative flutter surrounding a right hip replacement. He had a TEE/CV on 12/05/19 which  successful  Enjoys scuba diving.   Came in today with EKG demonstrating what looks like typical atrial flutter with variable conduction.  He is relatively asymptomatic.  May have some mild decreased energy perhaps.  No recent fevers chills nausea vomiting.  No recent surgeries.  His last episode of atrial flutter occurred surrounding his hip replacement.  This 1 appears to be unprovoked.  Dr. Lurene Shadow is his wife.  She is now working remotely from home with [employer].   Past Medical History:  Diagnosis Date   Arthritis    back   Atrial flutter ([place])    Has medication for this as needed.   History of kidney stones    LTBI (latent tuberculosis infection)    treated (latent) with rifampin, tested positive 30 years ago   Sleep apnea     Past Surgical History:  Procedure Laterality Date   BUBBLE STUDY  12/05/2019   Procedure: BUBBLE STUDY;  Surgeon: Pixie Casino, MD;  Location: [place];  Service: Cardiovascular;;   CARDIOVERSION N/A 12/05/2019   Procedure: CARDIOVERSION;  Surgeon: Pixie Casino, MD;  Location: [facility]COPY;  Service: Cardiovascular;  Laterality: N/A;   COLONOSCOPY     HEMANGIOMA EXCISION     Dr. [name]   LAMINECTOMY AND MICRODISCECTOMY LUMBAR SPINE  1999   L5-S1   LITHOTRIPSY     ORIF SHOULDER FRACTURE Right 11/29/2018   Procedure: RIGHT OPEN REDUCTION INTERNAL FIXATION (ORIF) SHOULDER FRACTURE;  Surgeon: [name], MD;  Location: [place];  Service: Orthopedics;  Laterality: Right;   TEE WITHOUT  CARDIOVERSION N/A 12/05/2019   Procedure: TRANSESOPHAGEAL ECHOCARDIOGRAM (TEE);  Surgeon: Pixie Casino, MD;  Location: [facility] END[facility]COPY;  Service: Cardiovascular;  Laterality: N/A;   TOTAL HIP ARTHROPLASTY Right 11/24/2019   Procedure: RIGHT TOTAL HIP ARTHROPLASTY ANTERIOR APPROACH;  Surgeon: Mcarthur Rossetti, MD;  Location: [facility];  Service: Orthopedics;  Laterality: Right;    Current Medications: Current Meds  Medication Sig   acetaminophen (TYLENOL) 500 MG tablet Take 1,000 mg by mouth in the morning, at noon, and at bedtime.   aspirin EC 81 MG tablet Take 1 tablet (81 mg total) by mouth daily. Swallow whole.   meloxicam (MOBIC) 15 MG tablet Take 15 mg by mouth daily as needed for pain.   psyllium (METAMUCIL) 28 % packet Take 1 packet by mouth 2 (two) times daily.   triamcinolone (NASACORT) 55 MCG/ACT AERO nasal inhaler Place 2 sprays into the nose daily as needed (allergies).   zolpidem (AMBIEN) 10 MG tablet Take 10 mg by mouth at bedtime as needed for sleep.     Allergies:   Patient has no known allergies.   Social History   Socioeconomic History   Marital status: Married    Spouse name: Not on file   Number of children: Not on file   Years of education: Not on file   Highest education level: Not on file  Occupational History   Not on  file  Tobacco Use   Smoking status: Some Days    Types: Cigars   Smokeless tobacco: Former   Tobacco comments:    occ  Vaping Use   Vaping Use: Never used  Substance and Sexual Activity   Alcohol use: Yes    Alcohol/week: 2.0 standard drinks of alcohol    Types: 2 Standard drinks or equivalent per week    Comment: occasional   Drug use: No   Sexual activity: Not on file  Other Topics Concern   Not on file  Social History Narrative   Not on file   Social Determinants of Health   Financial Resource Strain: Not on file  Food Insecurity: Not on file  Transportation Needs: Not on file  Physical Activity: Not on file  Stress:  Not on file  Social Connections: Not on file     Family History: The patient's family history includes Colon polyps in his mother; Heart failure in his father; Other in his mother; Renal Disease in his father; Rheum arthritis in his mother. There is no history of Colon cancer, Esophageal cancer, Stomach cancer, or Rectal cancer.  ROS:   Please see the history of present illness.     All other systems reviewed and are negative.  EKGs/Labs/Other Studies Reviewed:    The following studies were reviewed today:  ECHO TEE(12/05/19)   1. Left ventricular ejection fraction, by estimation, is 55 to 60%. The  left ventricle has normal function. There is mild left ventricular  hypertrophy. Left ventricular diastolic function could not be evaluated.   2. Right ventricular systolic function is normal. The right ventricular  size is normal.   3. No left atrial/left atrial appendage thrombus was detected.   4. The mitral valve is grossly normal. Trivial mitral valve  regurgitation.   5. The aortic valve is tricuspid. Aortic valve regurgitation is trivial.   6. Evidence of atrial level shunting detected by color flow Doppler.  Agitated saline contrast bubble study was positive with shunting observed  within 3-6 cardiac cycles suggestive of interatrial shunt. There is a  small patent foramen ovale with  predominantly left to right shunting across the atrial septum.   EKG:  EKG is  ordered today.  The ekg ordered today demonstrates AFLUTTER 49 variable conduction  Recent Labs: No results found for requested labs within last 365 days.  Recent Lipid Panel No results found for: "CHOL", "TRIG", "HDL", "CHOLHDL", "VLDL", "LDLCALC", "LDLDIRECT"   Risk Assessment/Calculations:              Physical Exam:    VS:  BP 120/80 (BP Location: Left Arm, Patient Position: Sitting, Cuff Size: Normal)   Pulse (!) 49   Ht '5\' 10"'$  (1.778 m)   Wt 228 lb (103.4 kg)   BMI 32.71 kg/m     Wt Readings  from Last 3 Encounters:  10/01/21 228 lb (103.4 kg)  08/26/20 220 lb (99.8 kg)  08/12/20 220 lb (99.8 kg)     GEN:  Well nourished, well developed in no acute distress HEENT: Normal NECK: No JVD; No carotid bruits LYMPHATICS: No lymphadenopathy CARDIAC: Irreg irreg, no murmurs, no rubs, gallops RESPIRATORY:  Clear to auscultation without rales, wheezing or rhonchi  ABDOMEN: Soft, non-tender, non-distended MUSCULOSKELETAL:  No edema; No deformity  SKIN: Warm and dry NEUROLOGIC:  Alert and oriented x 3 PSYCHIATRIC:  Normal affect   ASSESSMENT:    1. Atrial flutter, unspecified type ([place])   2. Patent foramen ovale  3. Typical atrial flutter (HCC)    PLAN:    In order of problems listed above:  Paroxysmal typical atrial flutter - Noted in the perioperative hip replacment period.  Successful cardioversion with TEE back in 2021.  He is not on anticoagulation because his CHA2DS2-VASc is 0.  Has diltiazem 60 mg to use as needed.   -Today on EKG showed flutter once again.  Still appears typical with good overall rate control, 49 bpm. - Given these findings of unprovoked atrial flutter, typical, I will refer him to electrophysiology to discuss potential ablation.  My fear is that even though he may only feel some mild decreased energy now, as he ages, this may affect his overall cardiac performance.  We will hold off on anticoagulation at this point.  Obviously if EP suggests [place] ablative anticoagulation etc. this can be initiated.    PFO - Small PFO noted on TEE.  No need for intervention.  Continue aspirin 81 mg.  Should be of no clinical significance.  He has not had any adverse effects from diving.  Understands precautions.      Medication Adjustments/Labs and Tests Ordered: Current medicines are reviewed at length with the patient today.  Concerns regarding medicines are outlined above.  Orders Placed This Encounter  Procedures   Ambulatory referral to Cardiac  Electrophysiology   EKG 12-Lead   No orders of the defined types were placed in this encounter.   Patient Instructions  Medication Instructions:  The current medical regimen is effective;  continue present plan and medications.  *If you need a refill on your cardiac medications before your next appointment, please call your pharmacy*  You have been referred to Electrophysiology to discuss possible At Flutter Ablation.  Follow-Up: At [facility], you and your health needs are our priority.  As part of our continuing mission to provide you with exceptional heart care, we have created designated Provider Care Teams.  These Care Teams include your primary Cardiologist (physician) and Advanced Practice Providers (APPs -  Physician Assistants and Nurse Practitioners) who all work together to provide you with the care you need, when you need it.  We recommend signing up for the patient portal called "MyChart".  Sign up information is provided on this After Visit Summary.  MyChart is used to connect with patients for Virtual Visits (Telemedicine).  Patients are able to view lab/test results, encounter notes, upcoming appointments, etc.  Non-urgent messages can be sent to your provider as well.   To learn more about what you can do with MyChart, go to [email or web address].    Your next appointment:   1 year(s)  The format for your next appointment:   In Person  Provider:   Candee Furbish, MD {   Important Information About Sugar         Signed, Candee Furbish, MD  10/01/2021 10:07 AM    [place]

## 2021-10-01 NOTE — Patient Instructions (Signed)
Medication Instructions:  The current medical regimen is effective;  continue present plan and medications.  *If you need a refill on your cardiac medications before your next appointment, please call your pharmacy*  You have been referred to Electrophysiology to discuss possible At Flutter Ablation.  Follow-Up: At [facility], you and your health needs are our priority.  As part of our continuing mission to provide you with exceptional heart care, we have created designated Provider Care Teams.  These Care Teams include your primary Cardiologist (physician) and Advanced Practice Providers (APPs -  Physician Assistants and Nurse Practitioners) who all work together to provide you with the care you need, when you need it.  We recommend signing up for the patient portal called "MyChart".  Sign up information is provided on this After Visit Summary.  MyChart is used to connect with patients for Virtual Visits (Telemedicine).  Patients are able to view lab/test results, encounter notes, upcoming appointments, etc.  Non-urgent messages can be sent to your provider as well.   To learn more about what you can do with MyChart, go to [email or web address].    Your next appointment:   1 year(s)  The format for your next appointment:   In Person  Provider:   Candee Furbish, MD {   Important Information About Sugar

## 2021-10-27 ENCOUNTER — Ambulatory Visit (INDEPENDENT_AMBULATORY_CARE_PROVIDER_SITE_OTHER): Payer: No Typology Code available for payment source | Admitting: Ophthalmology

## 2021-10-27 ENCOUNTER — Encounter (INDEPENDENT_AMBULATORY_CARE_PROVIDER_SITE_OTHER): Payer: Self-pay | Admitting: Ophthalmology

## 2021-10-27 ENCOUNTER — Encounter (INDEPENDENT_AMBULATORY_CARE_PROVIDER_SITE_OTHER): Payer: Self-pay

## 2021-10-27 DIAGNOSIS — H43311 Vitreous membranes and strands, right eye: Secondary | ICD-10-CM | POA: Diagnosis not present

## 2021-10-27 DIAGNOSIS — H43813 Vitreous degeneration, bilateral: Secondary | ICD-10-CM | POA: Diagnosis not present

## 2021-10-27 DIAGNOSIS — Z961 Presence of intraocular lens: Secondary | ICD-10-CM | POA: Diagnosis not present

## 2021-10-27 NOTE — Assessment & Plan Note (Signed)
Highly visual symptomatic most times during the day.  Panic impact on the patient's activities of daily living include sporting activities.  Nonetheless recent onset of symptoms only 3 weeks suggest important to delay any intervention of an invasive nature until sometime is passed to allow for further vitreous syneresis which might allow the vitreous strands and debris to move out of the visual axis or to another position.  Patient understands the considerations.  Discussion about vitrectomy at some length with the booklets reviewed.

## 2021-10-27 NOTE — Assessment & Plan Note (Signed)
OU looks great.

## 2021-10-27 NOTE — Progress Notes (Signed)
10/27/2021     CHIEF COMPLAINT Patient presents for  Chief Complaint  Patient presents with   Retina Evaluation      HISTORY OF PRESENT ILLNESS: [name] is a 61 y.o. male who presents to the clinic today for:   HPI     Retina Evaluation           Laterality: right eye         Comments     Np 2ed opinion vitreous degeneration abnoram vf od ref by R Thurmond Pt states his vision has been stable Pt denies any new floaters or FOL Pt states he has a gray spot in his right eye that moves around as he looks it moves with him.       Last edited by Hurman Horn, MD on 10/27/2021  3:04 PM.      Referring physician: Ginger Organ., MD [place],  [place] [id number]  HISTORICAL INFORMATION:   Selected notes from the MEDICAL RECORD NUMBER       CURRENT MEDICATIONS: No current outpatient medications on file. (Ophthalmic Drugs)   No current facility-administered medications for this visit. (Ophthalmic Drugs)   Current Outpatient Medications (Other)  Medication Sig   acetaminophen (TYLENOL) 500 MG tablet Take 1,000 mg by mouth in the morning, at noon, and at bedtime.   aspirin EC 81 MG tablet Take 1 tablet (81 mg total) by mouth daily. Swallow whole.   meloxicam (MOBIC) 15 MG tablet Take 15 mg by mouth daily as needed for pain.   psyllium (METAMUCIL) 28 % packet Take 1 packet by mouth 2 (two) times daily.   triamcinolone (NASACORT) 55 MCG/ACT AERO nasal inhaler Place 2 sprays into the nose daily as needed (allergies).   zolpidem (AMBIEN) 10 MG tablet Take 10 mg by mouth at bedtime as needed for sleep.   No current facility-administered medications for this visit. (Other)      REVIEW OF SYSTEMS: ROS   Negative for: Constitutional, Gastrointestinal, Neurological, Skin, Genitourinary, Musculoskeletal, HENT, Endocrine, Cardiovascular, Eyes, Respiratory, Psychiatric, Allergic/Imm, Heme/Lymph Last edited by Hurman Horn, MD on 10/27/2021   3:13 PM.       ALLERGIES No Known Allergies  PAST MEDICAL HISTORY Past Medical History:  Diagnosis Date   Arthritis    back   Atrial flutter ([place])    Has medication for this as needed.   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 [name]   LAMINECTOMY AND MICRODISCECTOMY LUMBAR SPINE  1999   L5-S1   LITHOTRIPSY     ORIF SHOULDER FRACTURE Right 11/29/2018   Procedure: RIGHT OPEN REDUCTION INTERNAL FIXATION (ORIF) SHOULDER FRACTURE;  Surgeon: [name], MD;  Location: [facility];  Service: Orthopedics;  Laterality: Right;   TEE WITHOUT CARDIOVERSION N/A 12/05/2019   Procedure: TRANSESOPHAGEAL ECHOCARDIOGRAM (TEE);  Surgeon: Pixie Casino, MD;  Location: [facility];  Service: Cardiovascular;  Laterality: N/A;   TOTAL HIP  ARTHROPLASTY Right 11/24/2019   Procedure: RIGHT TOTAL HIP ARTHROPLASTY ANTERIOR APPROACH;  Surgeon: Mcarthur Rossetti, MD;  Location: [facility];  Service: Orthopedics;  Laterality: Right;    FAMILY HISTORY Family History  Problem Relation Age of Onset   Colon polyps Mother    Other Mother        latent TB   Rheum arthritis Mother    Heart failure Father    Renal Disease Father    Colon cancer Neg Hx    Esophageal cancer Neg Hx    Stomach cancer Neg Hx    Rectal cancer Neg Hx     SOCIAL HISTORY Social History   Tobacco Use   Smoking status: Some Days    Types: Cigars   Smokeless tobacco: Former   Tobacco comments:    occ  Vaping Use    Vaping Use: Never used  Substance Use Topics   Alcohol use: Yes    Alcohol/week: 2.0 standard drinks of alcohol    Types: 2 Standard drinks or equivalent per week    Comment: occasional   Drug use: No         OPHTHALMIC EXAM:  Base Eye Exam     Visual Acuity (ETDRS)       Right Left   Dist [place] 20/30 +1 20/25         Tonometry (Tonopen, 2:44 PM)       Right Left   Pressure 5 8         Pupils       Pupils   Right PERRL   Left PERRL         Extraocular Movement       Right Left    Ortho Ortho    -- -- --  --  --  -- -- --   -- -- --  --  --  -- -- --           Dilation     Right eye: 2.5% Phenylephrine, 1.0% Mydriacyl @ 2:41 PM           Slit Lamp and Fundus Exam     External Exam       Right Left   External Normal Normal         Slit Lamp Exam       Right Left   Lids/Lashes Normal Normal   Conjunctiva/Sclera White and quiet White and quiet   Cornea Clear Clear   Anterior Chamber Deep and quiet Deep and quiet   Iris Round and reactive Round and reactive   Lens Centered posterior chamber intraocular lens Centered posterior chamber intraocular lens   Anterior Vitreous Large vitreous condensation inferior to the visual axis but near it, in the anterior hyaloid. No vitreous debris anteriorly OS         Fundus Exam       Right Left   Posterior Vitreous Central vitreous floaters, Posterior vitreous detachment Posterior vitreous detachment   Disc Normal Normal   C/D Ratio 0.15 0.15   Macula Normal Normal   Vessels Normal Normal   Periphery Normal, no holes or tears Normal, no holes or tears            IMAGING AND PROCEDURES  Imaging and Procedures for 10/27/21  OCT, Retina - OU - Both Eyes       Right Eye Scan locations included subfoveal. Central Foveal Thickness: 311. Progression has no prior data. Findings include normal foveal contour.   Left Eye  Quality was good. Scan locations included subfoveal. Central  Foveal Thickness: 306. Progression has no prior data. Findings include normal foveal contour.      Color Fundus Photography Optos - OU - Both Eyes       Right Eye Progression has been stable. Disc findings include normal observations. Macula : normal observations. Vessels : normal observations. Periphery : normal observations.   Left Eye Progression has been stable. Disc findings include normal observations. Macula : normal observations. Vessels : normal observations. Periphery : normal observations.   Notes Vitreous debris, visualized best on infrared viewing.             ASSESSMENT/PLAN:  Pseudophakia, both eyes OU looks great.  Posterior vitreous detachment of both eyes Physiologic OU no holes or tears  Vitreous membranes and strands, right eye Highly visual symptomatic most times during the day.  Panic impact on the patient's activities of daily living include sporting activities.  Nonetheless recent onset of symptoms only 3 weeks suggest important to delay any intervention of an invasive nature until sometime is passed to allow for further vitreous syneresis which might allow the vitreous strands and debris to move out of the visual axis or to another position.  Patient understands the considerations.  Discussion about vitrectomy at some length with the booklets reviewed.     ICD-10-CM   1. Vitreous membranes and strands, right eye  H43.311 OCT, Retina - OU - Both Eyes    Color Fundus Photography Optos - OU - Both Eyes    2. Pseudophakia, both eyes  Z96.1     3. Posterior vitreous detachment of both eyes  H43.813       1.  Overall consideration reviewed at length with the patient.  Visual field was reviewed and I am not concerned about the "outside normal limits of the findings.  This might be just the initial testing and patient learning to eat perform the test and I would suggest simply repeating the test as these considerations or not relative to the current  symptoms of strands and membranes affecting his quality of life in the right eye.  2.  Color fundus photography as well as infrared photography reviewed with the patient and I attempted to take a picture through the slit-lamp without success  3.  Ophthalmic Meds Ordered this visit:  No orders of the defined types were placed in this encounter.      Return in about 8 weeks (around 12/22/2021) for COLOR FP.  Patient Instructions  Patient to monitor for improvement or worsening of symptoms.  Explained the diagnoses, plan, and follow up with the patient and they expressed understanding.  Patient expressed understanding of the importance of proper follow up care.   [name] M.D. Diseases & Surgery of the Retina and Vitreous Retina [facility] Diabetic [place] 10/27/21     Abbreviations: M myopia (nearsighted); A astigmatism; H hyperopia (farsighted); P presbyopia; Mrx spectacle prescription;  CTL contact lenses; OD right eye; OS left eye; OU both eyes  XT exotropia; ET esotropia; PEK punctate epithelial keratitis; PEE punctate epithelial erosions; DES dry eye syndrome; MGD meibomian gland dysfunction; ATs artificial tears; PFAT's preservative free artificial tears; [place] nuclear sclerotic cataract; PSC posterior subcapsular cataract; ERM epi-retinal membrane; PVD posterior vitreous detachment; RD retinal detachment; DM diabetes mellitus; DR diabetic retinopathy; NPDR non-proliferative diabetic retinopathy; PDR proliferative diabetic retinopathy; CSME clinically significant macular edema; DME diabetic macular edema; dbh dot blot hemorrhages; CWS cotton wool spot; POAG primary open angle glaucoma;  C/D cup-to-disc ratio; HVF humphrey visual field; GVF goldmann visual field; OCT optical coherence tomography; IOP intraocular pressure; BRVO Branch retinal vein occlusion; CRVO central retinal vein occlusion; CRAO central retinal artery occlusion; BRAO branch retinal artery occlusion; RT retinal tear; SB  scleral buckle; PPV pars plana vitrectomy; VH Vitreous hemorrhage; PRP panretinal laser photocoagulation; IVK intravitreal kenalog; VMT vitreomacular traction; MH Macular hole;  NVD neovascularization of the disc; NVE neovascularization elsewhere; AREDS age related eye disease study; ARMD age related macular degeneration; POAG primary open angle glaucoma; EBMD epithelial/anterior basement membrane dystrophy; ACIOL anterior chamber intraocular lens; IOL intraocular lens; PCIOL posterior chamber intraocular lens; Phaco/IOL phacoemulsification with intraocular lens placement; [place] photorefractive keratectomy; LASIK laser assisted in situ keratomileusis; HTN hypertension; DM diabetes mellitus; COPD chronic obstructive pulmonary disease

## 2021-10-27 NOTE — Patient Instructions (Signed)
Patient to monitor for improvement or worsening of symptoms.

## 2021-10-27 NOTE — Assessment & Plan Note (Signed)
Physiologic OU no holes or tears 

## 2021-11-12 ENCOUNTER — Encounter: Payer: Self-pay | Admitting: *Deleted

## 2021-11-13 ENCOUNTER — Encounter: Payer: Self-pay | Admitting: Neurology

## 2021-11-13 ENCOUNTER — Ambulatory Visit (INDEPENDENT_AMBULATORY_CARE_PROVIDER_SITE_OTHER): Payer: No Typology Code available for payment source | Admitting: Neurology

## 2021-11-13 VITALS — BP 143/81 | HR 63 | Ht 70.0 in | Wt 233.0 lb

## 2021-11-13 DIAGNOSIS — G4719 Other hypersomnia: Secondary | ICD-10-CM | POA: Diagnosis not present

## 2021-11-13 DIAGNOSIS — E669 Obesity, unspecified: Secondary | ICD-10-CM | POA: Diagnosis not present

## 2021-11-13 DIAGNOSIS — Z9189 Other specified personal risk factors, not elsewhere classified: Secondary | ICD-10-CM | POA: Diagnosis not present

## 2021-11-13 DIAGNOSIS — I4892 Unspecified atrial flutter: Secondary | ICD-10-CM

## 2021-11-13 DIAGNOSIS — R0683 Snoring: Secondary | ICD-10-CM

## 2021-11-13 DIAGNOSIS — R351 Nocturia: Secondary | ICD-10-CM

## 2021-11-13 NOTE — Progress Notes (Addendum)
nocSubjective:    Patient ID: [name] is a 61 y.o. male.  HPI    Star Age, MD, PhD [facility] [address], [address] [place], [place] [address]  Dear Dr. Brigitte Pulse,   I saw your patient, [name], upon your kind request in my sleep clinic today for initial consultation of his sleep disorder, in particular, concern for underlying obstructive sleep apnea.  The patient is unaccompanied today.  As you know, [name] is a 61 year old left-handed (corrected on 11/13/2021) gentleman with an underlying medical history of arthritis status post lumbar surgery with microdiscectomy, ORIF right shoulder secondary to fracture, right total hip arthroplasty in 2021, eczema, nephrolithiasis, status post lithotripsy, paroxysmal atrial flutter, status post cardioversion in 2021, status post cataract extractions bilaterally in 2023 and obesity, who reports snoring and excessive daytime somnolence.  I reviewed your office note from 11/06/2021.  His Epworth sleepiness score is 14 out of 24, fatigue severity score is 37 out of 63.  He typically sees Dr. Marlou Porch in cardiology.  He has a consultation appointment with Dr. Crissie Sickles for possible application for chronic atrial flutter.  He goes to bed at different times, somewhere between 9 PM and 2 AM and rise time is as early as 5 and could be as late as 10 AM.  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  He has a bunny in the house.  He lives with his wife who is an ER physician.  His daughter is grown, works as a Public librarian.  He quit smoking at age 78, never was a heavy smoker.  He drinks alcohol occasionally, up to twice a week.  He drinks caffeine in the form of coffee, about 2 cups in the morning.  He was working as a Emergency planning/management officer, he had shift work.  He denies recurrent morning headaches.  He has nocturia about once per average night, better since he started Flomax.  Weight has been fluctuating  within 10 pounds up and down in the past few years.  He is not aware of any family history of sleep apnea.  He does not typically watch TV in his bedroom.  His Past Medical History Is Significant For: Past Medical History:  Diagnosis Date   Arthritis    back   Atrial flutter ([place])    Has medication for this as needed.   Eczema    History of kidney stones    LTBI (latent tuberculosis infection)    treated (latent) with rifampin, tested positive 30 years ago   Nephrolithiasis    Obesity    Sleep apnea     His Past Surgical History Is Significant For: Past Surgical History:  Procedure Laterality Date   BUBBLE STUDY  12/05/2019   Procedure: BUBBLE STUDY;  Surgeon: Pixie Casino, MD;  Location: [place];  Service: Cardiovascular;;   CARDIOVERSION N/A 12/05/2019   Procedure: CARDIOVERSION;  Surgeon: Pixie Casino, MD;  Location: [facility] ENDOSCOPY;  Service: Cardiovascular;  Laterality: N/A;   CATARACT EXTRACTION Bilateral    03/2021   COLONOSCOPY     HEMANGIOMA EXCISION     Dr. [name]   LAMINECTOMY AND MICRODISCECTOMY LUMBAR SPINE  1999   L5-S1   LITHOTRIPSY     2002   ORIF SHOULDER FRACTURE Right 11/29/2018   Procedure: RIGHT OPEN REDUCTION INTERNAL FIXATION (ORIF) SHOULDER FRACTURE;  Surgeon: [name], MD;  Location: [place];  Service: Orthopedics;  Laterality: Right;   TEE WITHOUT CARDIOVERSION N/A 12/05/2019   Procedure: TRANSESOPHAGEAL ECHOCARDIOGRAM (TEE);  Surgeon: Pixie Casino, MD;  Location: [facility];  Service: Cardiovascular;  Laterality: N/A;   TOTAL HIP ARTHROPLASTY Right 11/24/2019   Procedure: RIGHT TOTAL HIP ARTHROPLASTY ANTERIOR APPROACH;  Surgeon: Mcarthur Rossetti, MD;  Location: [facility];  Service: Orthopedics;  Laterality: Right;    His Family History Is Significant For: Family History  Problem Relation Age of Onset   Colon polyps Mother    Other Mother        latent TB   Rheum arthritis Mother    Hypertension Mother     Hyperlipidemia Mother    Hypothyroidism Mother    COPD Mother    Heart disease Mother 42 - 76       s/p MI   Heart failure Father    Renal Disease Father    Diabetes Father    Kidney disease Father    Hypothyroidism Sister    Polycystic ovary syndrome Sister    Cancer Paternal Grandmother    Kidney disease Paternal Grandfather    Colon cancer Neg Hx    Esophageal cancer Neg Hx    Stomach cancer Neg Hx    Rectal cancer Neg Hx    Sleep apnea Neg Hx     His Social History Is Significant For: Social History   Socioeconomic History   Marital status: Married    Spouse name: Not on file   Number of children: Not on file   Years of education: Not on file   Highest education level: Not on file  Occupational History   Not on file  Tobacco Use   Smoking status: Some Days    Types: Cigars   Smokeless tobacco: Former   Tobacco comments:    occ  Vaping Use   Vaping Use: Never used  Substance and Sexual Activity   Alcohol use: Yes    Alcohol/week: 2.0 standard drinks of alcohol    Types: 2 Standard drinks or equivalent per week    Comment: occasional   Drug use: No   Sexual activity: Not on file  Other Topics Concern   Not on file  Social History Narrative   Not on file   Social Determinants of Health   Financial Resource Strain: Not on file  Food Insecurity: Not on file  Transportation Needs: Not on file  Physical Activity: Not on file  Stress: Not on file  Social Connections: Not on file    His Allergies Are:  No Known Allergies:   His Current Medications Are:  Outpatient Encounter Medications as of 11/13/2021  Medication Sig   acetaminophen (TYLENOL) 500 MG tablet Take 1,000 mg by mouth in the morning, at noon, and at bedtime.   Ascorbic Acid (VITAMIN C PO) Take by mouth.   aspirin EC 81 MG tablet Take 1 tablet (81 mg total) by mouth daily. Swallow whole.   EPINEPHrine (EPIPEN 2-PAK) 0.3 mg/0.3 mL IJ SOAJ injection Inject as needed for anaphylaxis Injection    meloxicam (MOBIC) 15 MG tablet Take 1 tablet orally once a day for 30 days   psyllium (METAMUCIL) 28 % packet Take 1 packet by mouth 2 (two) times daily.   tamsulosin (FLOMAX) 0.4 MG CAPS capsule 1 capsule Orally Once a day at bedtime for 30 days   triamcinolone (NASACORT) 55 MCG/ACT AERO nasal inhaler Place 2 sprays into the nose daily as needed (allergies).   Triamcinolone Acetonide (TRIAMCINOLONE IN ABSORBASE) 0.05 %  OINT AAA twice a day as needed External   zolpidem (AMBIEN) 10 MG tablet Take 10 mg by mouth at bedtime as needed for sleep.   albuterol (VENTOLIN HFA) 108 (90 Base) MCG/ACT inhaler Use 2 puffs q 6 hours prn Inhalation as directed for 30 days   benzonatate (TESSALON) 100 MG capsule Take 1 capsule po 3x a day prn Orally as directed for 30 days   No facility-administered encounter medications on file as of 11/13/2021.  :   Review of Systems:  Out of a complete 14 point review of systems, all are reviewed and negative with the exception of these symptoms as listed below:   Review of Systems  Neurological:        Pt here for sleep consult   Pt snores,fatigue  Pt denies sleep consult,CPAP machine,headaches ,hypertension      ESS:14 FSS:37    Objective:  Neurological Exam  Physical Exam Physical Examination:   Vitals:   11/13/21 1120  BP: (!) 143/81  Pulse: 63    General Examination: The patient is a very pleasant 61 y.o. male in no acute distress. He appears well-developed and well-nourished and well groomed.   HEENT: Normocephalic, atraumatic, pupils are equal, round and reactive to light, extraocular tracking is good without limitation to gaze excursion or nystagmus noted. Hearing is grossly intact. Face is symmetric with normal facial animation. Speech is clear with no dysarthria noted. There is no hypophonia. There is no lip, neck/head, jaw or voice tremor. Neck is supple with full range of passive and active motion. There are no carotid bruits on auscultation.  Oropharynx exam reveals: mild mouth dryness, adequate dental hygiene and moderate airway crowding, due to small airway entry, minor uvula and wider tongue, Mallampati class II, tonsils small.  Tongue protrudes centrally and palate elevates symmetrically.  Minimal overbite. Neck circumference of 17-3/8 inches.  Chest: Clear to auscultation without wheezing, rhonchi or crackles noted.  Heart: S1+S2+0, regular and normal without murmurs, rubs or gallops noted.  Very slightly irregular.  Abdomen: Soft, non-tender and non-distended.  Extremities: There is no pitting edema in the distal lower extremities bilaterally.   Skin: Warm and dry without trophic changes noted.   Musculoskeletal: exam reveals no obvious joint deformities.   Neurologically:  Mental status: The patient is awake, alert and oriented in all 4 spheres. His immediate and remote memory, attention, language skills and fund of knowledge are appropriate. There is no evidence of aphasia, agnosia, apraxia or anomia. Speech is clear with normal prosody and enunciation. Thought process is linear. Mood is normal and affect is normal.  Cranial nerves II - XII are as described above under HEENT exam.  Motor exam: Normal bulk, strength and tone is noted. There is no obvious action or resting tremor.  Fine motor skills and coordination: grossly intact.  Cerebellar testing: No dysmetria or intention tremor. There is no truncal or gait ataxia.  Sensory exam: intact to light touch in the upper and lower extremities.  Gait, station and balance: He stands easily. No veering to one side is noted. No leaning to one side is noted. Posture is age-appropriate and stance is narrow based. Gait shows normal stride length and normal pace. No problems turning are noted.   Assessment and Plan:  In summary, Ediel K Sanfilippo is a very pleasant 61 y.o.-year old male with an underlying medical history of arthritis status post lumbar surgery with microdiscectomy,  ORIF right shoulder secondary to fracture, right total hip arthroplasty in 2021,  eczema, nephrolithiasis, status post lithotripsy, paroxysmal atrial flutter, status post cardioversion in 2021, status post cataract extractions bilaterally in 2023 and obesity, whose history and physical exam are concerning for sleep disordered breathing, supporting a current working diagnosis of unspecified sleep apnea, with the main differential diagnoses of obstructive sleep apnea (OSA) versus upper airway resistance syndrome (UARS) versus central sleep apnea (CSA), or mixed sleep apnea. A laboratory attended sleep study is considered gold standard for evaluation of sleep disordered breathing and is recommended at this time and clinically justified.   I had a long chat with the patient about my findings and the diagnosis of sleep apnea, particularly OSA, its prognosis and treatment options. We talked about medical/conservative treatments, surgical interventions and non-pharmacological approaches for symptom control. I explained, in particular, the risks and ramifications of untreated moderate to severe OSA, especially with respect to developing cardiovascular disease down the road, including congestive heart failure (CHF), difficult to treat hypertension, cardiac arrhythmias (particularly A-fib), neurovascular complications including TIA, stroke and dementia. Even type 2 diabetes has, in part, been linked to untreated OSA. Symptoms of untreated OSA may include (but may not be limited to) daytime sleepiness, nocturia (i.e. frequent nighttime urination), memory problems, mood irritability and suboptimally controlled or worsening mood disorder such as depression and/or anxiety, lack of energy, lack of motivation, physical discomfort, as well as recurrent headaches, especially morning or nocturnal headaches. We talked about the importance of maintaining a healthy lifestyle and striving for healthy weight. In addition, we talked about  the importance of striving for and maintaining good sleep hygiene. I recommended the following at this time: sleep study.  I outlined the differences between a laboratory attended sleep study which is considered more comprehensive and accurate over the option of a home sleep test (HST); the latter may lead to underestimation of sleep disordered breathing in some instances and does not help with diagnosing upper airway resistance syndrome and is not accurate enough to diagnose primary central sleep apnea typically. I explained the different sleep test procedures to the patient in detail and also outlined possible surgical and non-surgical treatment options of OSA, including the use of a pressure airway pressure (PAP) device (ie CPAP, AutoPAP/APAP or BiPAP in certain circumstances), a custom-made dental device (aka oral appliance, which would require a referral to a specialist dentist or orthodontist typically, and is generally speaking not considered a good choice for patients with full dentures or edentulous state), upper airway surgical options, such as traditional UPPP (which is not considered a first-line treatment) or the Inspire device (hypoglossal nerve stimulator, which would involve a referral for consultation with an ENT surgeon, after careful selection, following inclusion criteria). I explained the PAP treatment option to the patient in detail, as this is generally considered first-line treatment.  The patient indicated that he would be willing to try PAP therapy, if the need arises. I explained the importance of being compliant with PAP treatment, not only for insurance purposes but primarily to improve patient's symptoms symptoms, and for the patient's long term health benefit, including to reduce His cardiovascular risks longer-term.  If he is indeed a candidate for cardiac ablation for chronic atrial flutter, I would recommend that he be tested and treated for sleep apnea before his ablation  procedure if possible.   We will pick up our discussion about the next steps and treatment options after testing.  We will keep him posted as to the test results by phone call and/or MyChart messaging where possible.  We  will plan to follow-up in sleep clinic accordingly as well.  I answered all his questions today and the patient was in agreement.   I encouraged him to call with any interim questions, concerns, problems or updates or email [place] through [employer].  Generally speaking, sleep test authorizations may take up to 2 weeks, sometimes less, sometimes longer, the patient is encouraged to get in touch with [place] if they do not hear back from the sleep lab staff directly within the next 2 weeks.  Thank you very much for allowing me to participate in the care of this nice patient. If I can be of any further assistance to you please do not hesitate to call me at [phone].  Sincerely,   Star Age, MD, PhD

## 2021-11-13 NOTE — Patient Instructions (Signed)

## 2021-11-21 ENCOUNTER — Encounter: Payer: Self-pay | Admitting: Internal Medicine

## 2021-11-21 ENCOUNTER — Ambulatory Visit: Payer: BLUE CROSS/BLUE SHIELD | Attending: Internal Medicine | Admitting: Internal Medicine

## 2021-11-21 VITALS — BP 118/80 | HR 61 | Ht 70.0 in | Wt 235.0 lb

## 2021-11-21 DIAGNOSIS — I483 Typical atrial flutter: Secondary | ICD-10-CM | POA: Diagnosis not present

## 2021-11-21 NOTE — Progress Notes (Signed)
        HPI [name] is referred by Dr. Skains for evaluation of atrial flutter. He is a pleasant 61 yo man who developed atrial flutter 2 years ago and underwent DCCV. He felt better. He has developed recurrent flutter and is referred to consider catheter ablation. He does not have palpitations.  His VR has been controlled. No syncope. No chest pain  No Known Allergies           Current Outpatient Medications  Medication Sig Dispense Refill   acetaminophen (TYLENOL) 500 MG tablet Take 1,000 mg by mouth in the morning, at noon, and at bedtime.       Ascorbic Acid (VITAMIN C PO) Take by mouth.       aspirin EC 81 MG tablet Take 1 tablet (81 mg total) by mouth daily. Swallow whole. 90 tablet 3   EPINEPHrine (EPIPEN 2-PAK) 0.3 mg/0.3 mL IJ SOAJ injection Inject as needed for anaphylaxis Injection       meloxicam (MOBIC) 15 MG tablet Take 1 tablet orally once a day for 30 days       psyllium (METAMUCIL) 28 % packet Take 1 packet by mouth 2 (two) times daily.       tamsulosin (FLOMAX) 0.4 MG CAPS capsule 1 capsule Orally Once a day at bedtime for 30 days       triamcinolone (NASACORT) 55 MCG/ACT AERO nasal inhaler Place 2 sprays into the nose daily as needed (allergies).       Triamcinolone Acetonide (TRIAMCINOLONE IN ABSORBASE) 0.05 % OINT AAA twice a day as needed External       zolpidem (AMBIEN) 10 MG tablet Take 10 mg by mouth at bedtime as needed for sleep.        No current facility-administered medications for this visit.            Past Medical History:  Diagnosis Date   Arthritis      back   Atrial flutter ([facility])      Has medication for this as needed.   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          Past Surgical History:  Procedure  Laterality Date   BUBBLE STUDY   12/05/2019    Procedure: BUBBLE STUDY;  Surgeon: Hilty, Kenneth C, MD;  Location: [facility];  Service: Cardiovascular;;   CARDIOVERSION N/A 12/05/2019    Procedure: CARDIOVERSION;  Surgeon: Hilty, Kenneth C, MD;  Location: [facility];  Service: Cardiovascular;  Laterality: N/A;   CATARACT EXTRACTION Bilateral      03/2021   COLONOSCOPY       HEMANGIOMA EXCISION        Dr. Gramig   LAMINECTOMY AND MICRODISCECTOMY LUMBAR SPINE   1999    L5-S1   LITHOTRIPSY        2002   ORIF SHOULDER FRACTURE Right 11/29/2018    Procedure: RIGHT OPEN REDUCTION INTERNAL FIXATION (ORIF) SHOULDER FRACTURE;  Surgeon: Dean, Gregory Scott, MD;  Location: [facility];  Service: Orthopedics;  Laterality: Right;   TEE WITHOUT CARDIOVERSION N/A 12/05/2019    Procedure: TRANSESOPHAGEAL ECHOCARDIOGRAM (TEE);  Surgeon: Hilty, Kenneth   C, MD;  Location: [facility];  Service: Cardiovascular;  Laterality: N/A;   TOTAL HIP ARTHROPLASTY Right 11/24/2019    Procedure: RIGHT TOTAL HIP ARTHROPLASTY ANTERIOR APPROACH;  Surgeon: Blackman, Christopher Y, MD;  Location: [facility];  Service: Orthopedics;  Laterality: Right;             Family History  Problem Relation Age of Onset   Colon polyps Mother     Other Mother          latent TB   Rheum arthritis Mother     Hypertension Mother     Hyperlipidemia Mother     Hypothyroidism Mother     COPD Mother     Heart disease Mother 80 - 89        s/p MI   Heart failure Father     Renal Disease Father     Diabetes Father     Kidney disease Father     Hypothyroidism Sister     Polycystic ovary syndrome Sister     Cancer Paternal Grandmother     Kidney disease Paternal Grandfather     Colon cancer Neg Hx     Esophageal cancer Neg Hx     Stomach cancer Neg Hx     Rectal cancer Neg Hx     Sleep apnea Neg Hx          Social History         Socioeconomic History   Marital status: Married      Spouse name: Not on file   Number of  children: Not on file   Years of education: Not on file   Highest education level: Not on file  Occupational History   Not on file  Tobacco Use   Smoking status: Some Days      Types: Cigars   Smokeless tobacco: Former   Tobacco comments:      occ  Vaping Use   Vaping Use: Never used  Substance and Sexual Activity   Alcohol use: Yes      Alcohol/week: 2.0 standard drinks of alcohol      Types: 2 Standard drinks or equivalent per week      Comment: occasional   Drug use: No   Sexual activity: Not on file  Other Topics Concern   Not on file  Social History Narrative   Not on file    Social Determinants of Health    Financial Resource Strain: Not on file  Food Insecurity: Not on file  Transportation Needs: Not on file  Physical Activity: Not on file  Stress: Not on file  Social Connections: Not on file  Intimate Partner Violence: Not on file        BP 118/80   Pulse 61   Ht 5' 10" (1.778 m)   Wt 235 lb (106.6 kg)   BMI 33.72 kg/m    Physical Exam:   Well appearing NAD HEENT: Unremarkable Neck:  No JVD, no thyromegally Lymphatics:  No adenopathy Back:  No CVA tenderness Lungs:  Clear with no wheezes HEART:  Regular rate rhythm, no murmurs, no rubs, no clicks Abd:  soft, positive bowel sounds, no organomegally, no rebound, no guarding Ext:  2 plus pulses, no edema, no cyanosis, no clubbing Skin:  No rashes no nodules Neuro:  CN II through XII intact, motor grossly intact   EKG - reverse typical atrial flutter   Assess/Plan:  Atrial flutter -I have discussed the treatment options with the patient and

## 2021-11-21 NOTE — Patient Instructions (Addendum)
Medication Instructions:  Your physician recommends that you continue on your current medications as directed. Please refer to the Current Medication list given to you today.  *If you need a refill on your cardiac medications before your next appointment, please call your pharmacy*  Lab Work: None ordered.  If you have labs (blood work) drawn today and your tests are completely normal, you will receive your results only by: [place] (if you have MyChart) OR A paper copy in the mail If you have any lab test that is abnormal or we need to change your treatment, we will call you to review the results.  Testing/Procedures: None ordered.  Follow-Up: DATES FOR ATRIAL FLUTTER ABLATION WITH DR Cristopher Peru.  OCTOBER- 16  NOVEMBER- 1, 3, 6, 13, 15  PLEASE CONTACT us: Through MyChart or call [facility] on [phone]   Cardiac Ablation Cardiac ablation is a procedure to destroy, or ablate, a small amount of heart tissue in very specific places. The heart has many electrical connections. Sometimes these connections are abnormal and can cause the heart to beat very fast or irregularly. Ablating some of the areas that cause problems can improve the heart's rhythm or return it to normal. Ablation may be done for people who: Have Wolff-Parkinson-White syndrome. Have fast heart rhythms (tachycardia). Have taken medicines for an abnormal heart rhythm (arrhythmia) that were not effective or caused side effects. Have a high-risk heartbeat that may be life-threatening. During the procedure, a small incision is made in the neck or the groin, and a long, thin tube (catheter) is inserted into the incision and moved to the heart. Small devices (electrodes) on the tip of the catheter will send out electrical currents. A type of X-ray (fluoroscopy) will be used to help guide the catheter and to provide images of the heart. Tell a health care provider about: Any allergies you have. All  medicines you are taking, including vitamins, herbs, eye drops, creams, and over-the-counter medicines. Any problems you or family members have had with anesthetic medicines. Any blood disorders you have. Any surgeries you have had. Any medical conditions you have, such as kidney failure. Whether you are pregnant or may be pregnant. What are the risks? Generally, this is a safe procedure. However, problems may occur, including: Infection. Bruising and bleeding at the catheter insertion site. Bleeding into the chest, especially into the sac that surrounds the heart. This is a serious complication. Stroke or blood clots. Damage to nearby structures or organs. Allergic reaction to medicines or dyes. Need for a permanent pacemaker if the normal electrical system is damaged. A pacemaker is a small computer that sends electrical signals to the heart and helps your heart beat normally. The procedure not being fully effective. This may not be recognized until months later. Repeat ablation procedures are sometimes done. What happens before the procedure? Medicines Ask your health care provider about: Changing or stopping your regular medicines. This is especially important if you are taking diabetes medicines or blood thinners. Taking medicines such as aspirin and ibuprofen. These medicines can thin your blood. Do not take these medicines unless your health care provider tells you to take them. Taking over-the-counter medicines, vitamins, herbs, and supplements. General instructions Follow instructions from your health care provider about eating or drinking restrictions. Plan to have someone take you home from the hospital or clinic. If you will be going home right after the procedure, plan to have someone with you for 24 hours. Ask your health care provider  what steps will be taken to prevent infection. What happens during the procedure?  An IV will be inserted into one of your veins. You will  be given a medicine to help you relax (sedative). The skin on your neck or groin will be numbed. An incision will be made in your neck or your groin. A needle will be inserted through the incision and into a large vein in your neck or groin. A catheter will be inserted into the needle and moved to your heart. Dye may be injected through the catheter to help your surgeon see the area of the heart that needs treatment. Electrical currents will be sent from the catheter to ablate heart tissue in desired areas. There are three types of energy that may be used to do this: Heat (radiofrequency energy). Laser energy. Extreme cold (cryoablation). When the tissue has been ablated, the catheter will be removed. Pressure will be held on the insertion area to prevent a lot of bleeding. A bandage (dressing) will be placed over the insertion area. The exact procedure may vary among health care providers and hospitals. What happens after the procedure? Your blood pressure, heart rate, breathing rate, and blood oxygen level will be monitored until you leave the hospital or clinic. Your insertion area will be monitored for bleeding. You will need to lie still for a few hours to ensure that you do not bleed from the insertion area. Do not drive for 24 hours or as long as told by your health care provider. Summary Cardiac ablation is a procedure to destroy, or ablate, a small amount of heart tissue using an electrical current. This procedure can improve the heart rhythm or return it to normal. Tell your health care provider about any medical conditions you may have and all medicines you are taking to treat them. This is a safe procedure, but problems may occur. Problems may include infection, bruising, damage to nearby organs or structures, or allergic reactions to medicines. Follow your health care provider's instructions about eating and drinking before the procedure. You may also be told to change or stop some  of your medicines. After the procedure, do not drive for 24 hours or as long as told by your health care provider. This information is not intended to replace advice given to you by your health care provider. Make sure you discuss any questions you have with your health care provider. Document Revised: 04/25/2021 Document Reviewed: 12/12/2018 Elsevier Patient Education  [place].

## 2021-11-24 ENCOUNTER — Other Ambulatory Visit: Payer: Self-pay | Admitting: *Deleted

## 2021-11-24 DIAGNOSIS — I483 Typical atrial flutter: Secondary | ICD-10-CM

## 2021-11-25 ENCOUNTER — Ambulatory Visit: Payer: BLUE CROSS/BLUE SHIELD | Attending: Cardiology

## 2021-11-25 DIAGNOSIS — I483 Typical atrial flutter: Secondary | ICD-10-CM

## 2021-11-26 LAB — BASIC METABOLIC PANEL
BUN/Creatinine Ratio: 19 (ref 10–24)
BUN: 18 mg/dL (ref 8–27)
CO2: 24 mmol/L (ref 20–29)
Calcium: 8.9 mg/dL (ref 8.6–10.2)
Chloride: 105 mmol/L (ref 96–106)
Creatinine, Ser: 0.93 mg/dL (ref 0.76–1.27)
Glucose: 107 mg/dL — ABNORMAL HIGH (ref 70–99)
Potassium: 4.6 mmol/L (ref 3.5–5.2)
Sodium: 141 mmol/L (ref 134–144)
eGFR: 93 mL/min/{1.73_m2} (ref >59)

## 2021-11-26 LAB — CBC
Hematocrit: 42.4 % (ref 37.5–51.0)
Hemoglobin: 14.7 g/dL (ref 13.0–17.7)
MCH: 29.5 pg (ref 26.6–33.0)
MCHC: 34.7 g/dL (ref 31.5–35.7)
MCV: 85 fL (ref 79–97)
Platelets: 235 10*3/uL (ref 150–450)
RBC: 4.98 x10E6/uL (ref 4.14–5.80)
RDW: 13.2 % (ref 11.6–15.4)
WBC: 5.4 10*3/uL (ref 3.4–10.8)

## 2021-12-16 ENCOUNTER — Other Ambulatory Visit: Payer: Self-pay | Admitting: Registered Nurse

## 2021-12-16 ENCOUNTER — Ambulatory Visit
Admission: RE | Admit: 2021-12-16 | Discharge: 2021-12-16 | Disposition: A | Discharge status: Home / Self Care | Payer: No Typology Code available for payment source | Source: Ambulatory Visit | Attending: Registered Nurse | Admitting: Registered Nurse

## 2021-12-16 ENCOUNTER — Encounter (HOSPITAL_COMMUNITY): Payer: Self-pay | Admitting: Certified Registered Nurse Anesthetist

## 2021-12-16 ENCOUNTER — Telehealth: Payer: Self-pay | Admitting: Internal Medicine

## 2021-12-16 DIAGNOSIS — R1030 Lower abdominal pain, unspecified: Secondary | ICD-10-CM

## 2021-12-16 DIAGNOSIS — I4892 Unspecified atrial flutter: Secondary | ICD-10-CM

## 2021-12-16 MED ORDER — IOPAMIDOL (ISOVUE-300) INJECTION 61%
100.0000 mL | Freq: Once | INTRAVENOUS | Status: AC | PRN
  Administered 2021-12-16: 100 mL via INTRAVENOUS

## 2021-12-16 NOTE — Telephone Encounter (Signed)
Patient states he is experiencing lower abdominal pain/cramping which has been going on since last night. He assumes it is diverticulitis related and denies N/V/D.

## 2021-12-16 NOTE — Pre-Procedure Instructions (Signed)
Instructed patient on the following items: Arrival time 0630 Nothing to eat or drink after midnight No meds AM of procedure Responsible person to drive you home and stay with you for 24 hrs

## 2021-12-16 NOTE — Pre-Procedure Instructions (Signed)
Discussed with Dr Lovena Le that patient has been having some abdominal pain and was having a CT scan today.  Also patient hasn't been on a blood thinner.  Dr Lovena Le thought best to cancel procedure for tomorrow and reschedule when patient is feeling better.  Notified patient that procedure for tomorrow is cancelled.  Office will be in touch with patient when he is feeling better.

## 2021-12-17 ENCOUNTER — Encounter (HOSPITAL_COMMUNITY): Admission: RE | Payer: Self-pay | Source: Home / Self Care

## 2021-12-17 ENCOUNTER — Ambulatory Visit (HOSPITAL_COMMUNITY)
Admission: RE | Admit: 2021-12-17 | Payer: BLUE CROSS/BLUE SHIELD | Source: Home / Self Care | Admitting: Internal Medicine

## 2021-12-17 SURGERY — A-FLUTTER ABLATION
Anesthesia: General

## 2021-12-17 NOTE — Telephone Encounter (Signed)
Per Dr. Lovena Le, Pt's Atrial Flutter Ablation has been cancelled, and will need to be rescheduled for a future date and time.   Follow up required.

## 2021-12-18 NOTE — Telephone Encounter (Signed)
Pt called and Case rescheduled with patient for 02/18/2022 at 830 am;  Dr. Lovena Le wants Carto and Anesthesia.  Pt chose date, and will MyChart new procedure letter.  Scheduling / precert made aware of changes.

## 2021-12-18 NOTE — Addendum Note (Signed)
Addended by: Oleta Mouse C on: 12/18/2021 04:52 PM   Modules accepted: Orders

## 2021-12-18 NOTE — Telephone Encounter (Signed)
Lab orders entered for Pt procedure 02/18/2022;   Procedure letter completed 12/18/2021.

## 2021-12-22 ENCOUNTER — Encounter (INDEPENDENT_AMBULATORY_CARE_PROVIDER_SITE_OTHER): Payer: No Typology Code available for payment source | Admitting: Ophthalmology

## 2022-01-15 ENCOUNTER — Ambulatory Visit: Payer: No Typology Code available for payment source | Admitting: Internal Medicine

## 2022-01-28 ENCOUNTER — Encounter: Payer: Self-pay | Admitting: Neurology

## 2022-02-02 NOTE — Telephone Encounter (Signed)
[facility] [place] no auth req spoke to [place] ref # [id number].  Patient is scheduled at [facility] For 04/06/22 at 9 pm.  Mailed packet to the patient.

## 2022-02-06 ENCOUNTER — Ambulatory Visit: Payer: BLUE CROSS/BLUE SHIELD | Attending: Internal Medicine

## 2022-02-06 DIAGNOSIS — I4892 Unspecified atrial flutter: Secondary | ICD-10-CM

## 2022-02-06 LAB — CBC WITH DIFFERENTIAL/PLATELET

## 2022-02-07 LAB — CBC WITH DIFFERENTIAL/PLATELET
Basophils Absolute: 0.1 10*3/uL (ref 0.0–0.2)
Basos: 1 %
EOS (ABSOLUTE): 0.2 10*3/uL (ref 0.0–0.4)
Eos: 4 %
Hematocrit: 46.7 % (ref 37.5–51.0)
Hemoglobin: 15.7 g/dL (ref 13.0–17.7)
Immature Grans (Abs): 0 10*3/uL (ref 0.0–0.1)
Immature Granulocytes: 0 %
Lymphocytes Absolute: 1.8 10*3/uL (ref 0.7–3.1)
Lymphs: 31 %
MCH: 30 pg (ref 26.6–33.0)
MCHC: 33.6 g/dL (ref 31.5–35.7)
MCV: 89 fL (ref 79–97)
Monocytes Absolute: 0.6 10*3/uL (ref 0.1–0.9)
Monocytes: 11 %
Neutrophils Absolute: 3 10*3/uL (ref 1.4–7.0)
Neutrophils: 53 %
Platelets: 224 10*3/uL (ref 150–450)
RBC: 5.23 x10E6/uL (ref 4.14–5.80)
RDW: 13.7 % (ref 11.6–15.4)
WBC: 5.7 10*3/uL (ref 3.4–10.8)

## 2022-02-07 LAB — BASIC METABOLIC PANEL
BUN/Creatinine Ratio: 17 (ref 10–24)
BUN: 17 mg/dL (ref 8–27)
CO2: 25 mmol/L (ref 20–29)
Calcium: 9.4 mg/dL (ref 8.6–10.2)
Chloride: 107 mmol/L — ABNORMAL HIGH (ref 96–106)
Creatinine, Ser: 1.01 mg/dL (ref 0.76–1.27)
Glucose: 99 mg/dL (ref 70–99)
Potassium: 4.8 mmol/L (ref 3.5–5.2)
Sodium: 144 mmol/L (ref 134–144)
eGFR: 84 mL/min/{1.73_m2} (ref >59)

## 2022-02-17 ENCOUNTER — Other Ambulatory Visit: Payer: Self-pay | Admitting: Student

## 2022-02-17 DIAGNOSIS — I4892 Unspecified atrial flutter: Secondary | ICD-10-CM

## 2022-02-17 MED ORDER — APIXABAN 5 MG PO TABS: 5.0000 mg | ORAL_TABLET | Freq: Two times a day (BID) | ORAL | 6 refills | Status: DC

## 2022-02-18 ENCOUNTER — Encounter (HOSPITAL_COMMUNITY): Admission: RE | Payer: Self-pay | Source: Home / Self Care

## 2022-02-18 ENCOUNTER — Ambulatory Visit (HOSPITAL_COMMUNITY)
Admission: RE | Admit: 2022-02-18 | Payer: BLUE CROSS/BLUE SHIELD | Source: Home / Self Care | Admitting: Internal Medicine

## 2022-02-18 SURGERY — A-FLUTTER ABLATION
Anesthesia: General

## 2022-02-18 NOTE — Pre-Procedure Instructions (Signed)
Spoke with patient to get ablation rescheduled.  Ablation now scheduled for 1/29 @ 0930, be at the hospital @ 0730.  Nothing to eat or drink after midnight.  Don't take any medications on Monday morning.  Don't missed any doses of Eliquis or procedure may be cancelled.  Patient verbalized understanding.

## 2022-03-13 NOTE — Pre-Procedure Instructions (Signed)
Attempted to call patient regarding procedure instructions.  Left voicemail on the following items: Arrival time 0730 Nothing to eat or drink after midnight No meds AM of procedure Responsible person to drive you home and stay with you for 24 hrs  Have you missed any doses of anti-coagulant Eliquis- if you have missed any doses please let office know right away.

## 2022-03-16 ENCOUNTER — Ambulatory Visit (HOSPITAL_COMMUNITY): Payer: BC Managed Care – PPO | Admitting: Anesthesiology

## 2022-03-16 ENCOUNTER — Ambulatory Visit (HOSPITAL_COMMUNITY)
Admission: RE | Admit: 2022-03-16 | Discharge: 2022-03-16 | Disposition: A | Discharge status: Home / Self Care | Payer: BC Managed Care – PPO | Attending: Internal Medicine | Admitting: Internal Medicine

## 2022-03-16 ENCOUNTER — Encounter (HOSPITAL_COMMUNITY): Payer: Self-pay | Admitting: Internal Medicine

## 2022-03-16 ENCOUNTER — Other Ambulatory Visit: Payer: Self-pay

## 2022-03-16 ENCOUNTER — Encounter (HOSPITAL_COMMUNITY)
Admission: RE | Disposition: A | Discharge status: Home / Self Care | Payer: Self-pay | Source: Home / Self Care | Attending: Internal Medicine

## 2022-03-16 DIAGNOSIS — I10 Essential (primary) hypertension: Secondary | ICD-10-CM | POA: Insufficient documentation

## 2022-03-16 DIAGNOSIS — I483 Typical atrial flutter: Secondary | ICD-10-CM

## 2022-03-16 DIAGNOSIS — Z87891 Personal history of nicotine dependence: Secondary | ICD-10-CM | POA: Diagnosis not present

## 2022-03-16 HISTORY — PX: A-FLUTTER ABLATION: EP1230

## 2022-03-16 LAB — CBC
HCT: 45.4 % (ref 39.0–52.0)
Hemoglobin: 16 g/dL (ref 13.0–17.0)
MCH: 30.3 pg (ref 26.0–34.0)
MCHC: 35.2 g/dL (ref 30.0–36.0)
MCV: 86 fL (ref 80.0–100.0)
Platelets: 230 10*3/uL (ref 150–400)
RBC: 5.28 MIL/uL (ref 4.22–5.81)
RDW: 13.5 % (ref 11.5–15.5)
WBC: 6.1 10*3/uL (ref 4.0–10.5)
nRBC: 0 % (ref 0.0–0.2)

## 2022-03-16 LAB — BASIC METABOLIC PANEL
Anion gap: 10 (ref 5–15)
BUN: 14 mg/dL (ref 8–23)
CO2: 25 mmol/L (ref 22–32)
Calcium: 9 mg/dL (ref 8.9–10.3)
Chloride: 106 mmol/L (ref 98–111)
Creatinine, Ser: 1.05 mg/dL (ref 0.61–1.24)
GFR, Estimated: >60 mL/min (ref >60)
Glucose, Bld: 104 mg/dL — ABNORMAL HIGH (ref 70–99)
Potassium: 3.8 mmol/L (ref 3.5–5.1)
Sodium: 141 mmol/L (ref 135–145)

## 2022-03-16 SURGERY — A-FLUTTER ABLATION
Anesthesia: General

## 2022-03-16 MED ORDER — ACETAMINOPHEN 500 MG PO TABS
1000.0000 mg | ORAL_TABLET | Freq: Once | ORAL | Status: AC
  Administered 2022-03-16: 1000 mg via ORAL
  Filled 2022-03-16: qty 2

## 2022-03-16 MED ORDER — SODIUM CHLORIDE 0.9 % IV SOLN: INTRAVENOUS | Status: DC

## 2022-03-16 MED ORDER — ROCURONIUM BROMIDE 10 MG/ML (PF) SYRINGE
PREFILLED_SYRINGE | INTRAVENOUS | Status: DC | PRN
  Administered 2022-03-16: 70 mg via INTRAVENOUS

## 2022-03-16 MED ORDER — PHENYLEPHRINE HCL-NACL 20-0.9 MG/250ML-% IV SOLN
INTRAVENOUS | Status: DC | PRN
  Administered 2022-03-16: 25 ug/min via INTRAVENOUS

## 2022-03-16 MED ORDER — SODIUM CHLORIDE 0.9% FLUSH: 3.0000 mL | Freq: Two times a day (BID) | INTRAVENOUS | Status: DC

## 2022-03-16 MED ORDER — SUGAMMADEX SODIUM 200 MG/2ML IV SOLN
INTRAVENOUS | Status: DC | PRN
  Administered 2022-03-16: 200 mg via INTRAVENOUS

## 2022-03-16 MED ORDER — HEPARIN SODIUM (PORCINE) 1000 UNIT/ML IJ SOLN
INTRAMUSCULAR | Status: DC | PRN
  Administered 2022-03-16: 1000 [IU] via INTRAVENOUS

## 2022-03-16 MED ORDER — MIDAZOLAM HCL 2 MG/2ML IJ SOLN
INTRAMUSCULAR | Status: DC | PRN
  Administered 2022-03-16: 2 mg via INTRAVENOUS

## 2022-03-16 MED ORDER — LIDOCAINE 2% (20 MG/ML) 5 ML SYRINGE
INTRAMUSCULAR | Status: DC | PRN
  Administered 2022-03-16: 100 mg via INTRAVENOUS

## 2022-03-16 MED ORDER — ONDANSETRON HCL 4 MG/2ML IJ SOLN
INTRAMUSCULAR | Status: DC | PRN
  Administered 2022-03-16: 4 mg via INTRAVENOUS

## 2022-03-16 MED ORDER — DEXAMETHASONE SODIUM PHOSPHATE 10 MG/ML IJ SOLN
INTRAMUSCULAR | Status: DC | PRN
  Administered 2022-03-16: 10 mg via INTRAVENOUS

## 2022-03-16 MED ORDER — HEPARIN SODIUM (PORCINE) 1000 UNIT/ML IJ SOLN
INTRAMUSCULAR | Status: AC
  Filled 2022-03-16: qty 10

## 2022-03-16 MED ORDER — SODIUM CHLORIDE 0.9% FLUSH: 3.0000 mL | INTRAVENOUS | Status: DC | PRN

## 2022-03-16 MED ORDER — SODIUM CHLORIDE 0.9 % IV SOLN: 250.0000 mL | INTRAVENOUS | Status: DC | PRN

## 2022-03-16 MED ORDER — ACETAMINOPHEN 325 MG PO TABS: 650.0000 mg | ORAL_TABLET | ORAL | Status: DC | PRN

## 2022-03-16 MED ORDER — ONDANSETRON HCL 4 MG/2ML IJ SOLN: 4.0000 mg | Freq: Four times a day (QID) | INTRAMUSCULAR | Status: DC | PRN

## 2022-03-16 MED ORDER — FENTANYL CITRATE (PF) 100 MCG/2ML IJ SOLN
INTRAMUSCULAR | Status: DC | PRN
  Administered 2022-03-16: 100 ug via INTRAVENOUS

## 2022-03-16 MED ORDER — PROPOFOL 10 MG/ML IV BOLUS
INTRAVENOUS | Status: DC | PRN
  Administered 2022-03-16: 160 mg via INTRAVENOUS

## 2022-03-16 MED ORDER — HEPARIN (PORCINE) IN NACL 1000-0.9 UT/500ML-% IV SOLN
INTRAVENOUS | Status: DC | PRN
  Administered 2022-03-16: 500 mL

## 2022-03-16 MED ORDER — PHENYLEPHRINE 80 MCG/ML (10ML) SYRINGE FOR IV PUSH (FOR BLOOD PRESSURE SUPPORT)
PREFILLED_SYRINGE | INTRAVENOUS | Status: DC | PRN
  Administered 2022-03-16: 80 ug via INTRAVENOUS

## 2022-03-16 SURGICAL SUPPLY — 12 items
BAG SNAP BAND KOVER 36X36 (MISCELLANEOUS) IMPLANT
CATH JOSEPH QUAD ALLRED 6F REP (CATHETERS) IMPLANT
CATH SMTCH THERMOCOOL SF FJ (CATHETERS) IMPLANT
CATH WEB BI DIR CSDF CRV REPRO (CATHETERS) IMPLANT
PACK EP LATEX FREE (CUSTOM PROCEDURE TRAY) ×1
PACK EP LF (CUSTOM PROCEDURE TRAY) ×1 IMPLANT
PAD DEFIB RADIO PHYSIO CONN (PAD) ×1 IMPLANT
PATCH CARTO3 (PAD) IMPLANT
SHEATH PINNACLE 6F 10CM (SHEATH) IMPLANT
SHEATH PINNACLE 7F 10CM (SHEATH) IMPLANT
SHEATH PINNACLE 8F 10CM (SHEATH) IMPLANT
TUBING SMART ABLATE COOLFLOW (TUBING) IMPLANT

## 2022-03-16 NOTE — Anesthesia Preprocedure Evaluation (Addendum)
Anesthesia Evaluation  Patient identified by MRN, date of birth, ID band Patient awake    Reviewed: Allergy & Precautions, NPO status , Patient's Chart, lab work & pertinent test results  Airway Mallampati: III  TM Distance: >3 FB Neck ROM: Full    Dental  (+) Dental Advisory Given, Implants, Chipped,    Pulmonary sleep apnea , Current Smoker and Patient abstained from smoking.   Pulmonary exam normal breath sounds clear to auscultation       Cardiovascular (-) hypertension(-) angina + dysrhythmias Atrial Fibrillation  Rhythm:Irregular Rate:Normal     Neuro/Psych negative neurological ROS  negative psych ROS   GI/Hepatic negative GI ROS, Neg liver ROS,,,  Endo/Other  Obesity   Renal/GU negative Renal ROS     Musculoskeletal  (+) Arthritis , Osteoarthritis,    Abdominal   Peds  Hematology  (+) Blood dyscrasia (Eliquis)   Anesthesia Other Findings Day of surgery medications reviewed with the patient.  Reproductive/Obstetrics                             Anesthesia Physical Anesthesia Plan  ASA: 3  Anesthesia Plan: General   Post-op Pain Management: Tylenol PO (pre-op)*   Induction: Intravenous  PONV Risk Score and Plan: 1 and Midazolam, Dexamethasone and Ondansetron  Airway Management Planned: Oral ETT  Additional Equipment:   Intra-op Plan:   Post-operative Plan: Extubation in OR  Informed Consent: I have reviewed the patients History and Physical, chart, labs and discussed the procedure including the risks, benefits and alternatives for the proposed anesthesia with the patient or authorized representative who has indicated his/her understanding and acceptance.     Dental advisory given  Plan Discussed with: CRNA  Anesthesia Plan Comments:         Anesthesia Quick Evaluation

## 2022-03-16 NOTE — Transfer of Care (Signed)
Immediate Anesthesia Transfer of Care Note  Patient: [name]  Procedure(s) Performed: A-FLUTTER ABLATION  Patient Location: Cath Lab  Anesthesia Type:General  Level of Consciousness: awake and alert   Airway & Oxygen Therapy: Patient Spontanous Breathing and Patient connected to nasal cannula oxygen  Post-op Assessment: Report given to RN, Post -op Vital signs reviewed and stable, and Patient moving all extremities X 4  Post vital signs: Reviewed and stable  Last Vitals:  Vitals Value Taken Time  BP 156/84 03/16/22 1126  Temp    Pulse 75 03/16/22 1127  Resp 10 03/16/22 1127  SpO2 97 % 03/16/22 1127  Vitals shown include unvalidated device data.  Last Pain:  Vitals:   03/16/22 0809  TempSrc: Temporal  PainSc: 0-No pain         Complications: No notable events documented.

## 2022-03-16 NOTE — Anesthesia Procedure Notes (Signed)
Procedure Name: Intubation Date/Time: 03/16/2022 10:03 AM  Performed by: Harden Mo, CRNAPre-anesthesia Checklist: Patient identified, Emergency Drugs available, Suction available and Patient being monitored Patient Re-evaluated:Patient Re-evaluated prior to induction Oxygen Delivery Method: Circle System Utilized Preoxygenation: Pre-oxygenation with 100% oxygen Induction Type: IV induction Ventilation: Mask ventilation without difficulty and Oral airway inserted - appropriate to patient size Laryngoscope Size: McGraph and 4 Grade View: Grade I Tube type: Oral Tube size: 7.5 mm Number of attempts: 1 Airway Equipment and Method: Stylet and Oral airway Placement Confirmation: ETT inserted through vocal cords under direct vision, positive ETCO2 and breath sounds checked- equal and bilateral Secured at: 24 cm Tube secured with: Tape Dental Injury: Teeth and Oropharynx as per pre-operative assessment

## 2022-03-16 NOTE — Discharge Instructions (Signed)

## 2022-03-16 NOTE — H&P (Signed)
HPI [name] is referred by Dr. Marlou Porch for evaluation of atrial flutter. He is a pleasant 62 yo man who developed atrial flutter 2 years ago and underwent DCCV. He felt better. He has developed recurrent flutter and is referred to consider catheter ablation. He does not have palpitations.  His VR has been controlled. No syncope. No chest pain  No Known Allergies           Current Outpatient Medications  Medication Sig Dispense Refill   acetaminophen (TYLENOL) 500 MG tablet Take 1,000 mg by mouth in the morning, at noon, and at bedtime.       Ascorbic Acid (VITAMIN C PO) Take by mouth.       aspirin EC 81 MG tablet Take 1 tablet (81 mg total) by mouth daily. Swallow whole. 90 tablet 3   EPINEPHrine (EPIPEN 2-PAK) 0.3 mg/0.3 mL IJ SOAJ injection Inject as needed for anaphylaxis Injection       meloxicam (MOBIC) 15 MG tablet Take 1 tablet orally once a day for 30 days       psyllium (METAMUCIL) 28 % packet Take 1 packet by mouth 2 (two) times daily.       tamsulosin (FLOMAX) 0.4 MG CAPS capsule 1 capsule Orally Once a day at bedtime for 30 days       triamcinolone (NASACORT) 55 MCG/ACT AERO nasal inhaler Place 2 sprays into the nose daily as needed (allergies).       Triamcinolone Acetonide (TRIAMCINOLONE IN ABSORBASE) 0.05 % OINT AAA twice a day as needed External       zolpidem (AMBIEN) 10 MG tablet Take 10 mg by mouth at bedtime as needed for sleep.        No current facility-administered medications for this visit.            Past Medical History:  Diagnosis Date   Arthritis      back   Atrial flutter ([place])      Has medication for this as needed.   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          Past Surgical History:  Procedure  Laterality Date   BUBBLE STUDY   12/05/2019    Procedure: BUBBLE STUDY;  Surgeon: Pixie Casino, MD;  Location: [place];  Service: Cardiovascular;;   CARDIOVERSION N/A 12/05/2019    Procedure: CARDIOVERSION;  Surgeon: Pixie Casino, MD;  Location[facility] ENDOSCOPY;  Service: Cardiovascular;  Laterality: N/A;   CATARACT EXTRACTION Bilateral      03/2021   COLONOSCOPY       HEMANGIOMA EXCISION        Dr. [name]   LAMINECTOMY AND MICRODISCECTOMY LUMBAR SPINE   1999    L5-S1   LITHOTRIPSY        2002   ORIF SHOULDER FRACTURE Right 11/29/2018    Procedure: RIGHT OPEN REDUCTION INTERNAL FIXATION (ORIF) SHOULDER FRACTURE;  Surgeon: [name], MD;  Location: [place];  Service: Orthopedics;  Laterality: Right;   TEE WITHOUT CARDIOVERSION N/A 12/05/2019    Procedure: TRANSESOPHAGEAL ECHOCARDIOGRAM (TEE);  Surgeon: Lyman Bishop  C, MD;  Location: [place];  Service: Cardiovascular;  Laterality: N/A;   TOTAL HIP ARTHROPLASTY Right 11/24/2019    Procedure: RIGHT TOTAL HIP ARTHROPLASTY ANTERIOR APPROACH;  Surgeon: Mcarthur Rossetti, MD;  Location: [facility];  Service: Orthopedics;  Laterality: Right;             Family History  Problem Relation Age of Onset   Colon polyps Mother     Other Mother          latent TB   Rheum arthritis Mother     Hypertension Mother     Hyperlipidemia Mother     Hypothyroidism Mother     COPD Mother     Heart disease Mother 72 - 1        s/p MI   Heart failure Father     Renal Disease Father     Diabetes Father     Kidney disease Father     Hypothyroidism Sister     Polycystic ovary syndrome Sister     Cancer Paternal Grandmother     Kidney disease Paternal Grandfather     Colon cancer Neg Hx     Esophageal cancer Neg Hx     Stomach cancer Neg Hx     Rectal cancer Neg Hx     Sleep apnea Neg Hx          Social History         Socioeconomic History   Marital status: Married      Spouse name: Not on file   Number of  children: Not on file   Years of education: Not on file   Highest education level: Not on file  Occupational History   Not on file  Tobacco Use   Smoking status: Some Days      Types: Cigars   Smokeless tobacco: Former   Tobacco comments:      occ  Vaping Use   Vaping Use: Never used  Substance and Sexual Activity   Alcohol use: Yes      Alcohol/week: 2.0 standard drinks of alcohol      Types: 2 Standard drinks or equivalent per week      Comment: occasional   Drug use: No   Sexual activity: Not on file  Other Topics Concern   Not on file  Social History Narrative   Not on file    Social Determinants of Health    Financial Resource Strain: Not on file  Food Insecurity: Not on file  Transportation Needs: Not on file  Physical Activity: Not on file  Stress: Not on file  Social Connections: Not on file  Intimate Partner Violence: Not on file        BP 118/80   Pulse 61   Ht '5\' 10"'$  (1.778 m)   Wt 235 lb (106.6 kg)   BMI 33.72 kg/m    Physical Exam:   Well appearing NAD HEENT: Unremarkable Neck:  No JVD, no thyromegally Lymphatics:  No adenopathy Back:  No CVA tenderness Lungs:  Clear with no wheezes HEART:  Regular rate rhythm, no murmurs, no rubs, no clicks Abd:  soft, positive bowel sounds, no organomegally, no rebound, no guarding Ext:  2 plus pulses, no edema, no cyanosis, no clubbing Skin:  No rashes no nodules Neuro:  CN II through XII intact, motor grossly intact   EKG - reverse typical atrial flutter   Assess/Plan:  Atrial flutter -I have discussed the treatment options with the patient and  recommend proceeding with catheter ablation. I have reviewed the indications/risks/benefits/goals/expectations and he will call us if he wishes to proceed. HTN - his bp is controlled today. We will follow.   Carleene Overlie Nahiem Dredge,MD

## 2022-03-16 NOTE — Progress Notes (Signed)
Site area: Right groin a 6,7, and 8 french venous sheath was removed  Site Prior to Removal:  Level 0  Pressure Applied For 15 MINUTES    Bedrest Beginning at 1200pm X 6 hours  Manual:   Yes.    Patient Status During Pull:  stable  Post Pull Groin Site:  Level 0  Post Pull Instructions Given:  Yes.    Post Pull Pulses Present:  Yes.    Dressing Applied:  Yes.    Comments:

## 2022-03-17 ENCOUNTER — Encounter (HOSPITAL_COMMUNITY): Payer: Self-pay | Admitting: Internal Medicine

## 2022-03-17 NOTE — Anesthesia Postprocedure Evaluation (Signed)
Anesthesia Post Note  Patient: [name]  Procedure(s) Performed: A-FLUTTER ABLATION     Patient location during evaluation: PACU Anesthesia Type: General Level of consciousness: awake and alert Pain management: pain level controlled Vital Signs Assessment: post-procedure vital signs reviewed and stable Respiratory status: spontaneous breathing, nonlabored ventilation, respiratory function stable and patient connected to nasal cannula oxygen Cardiovascular status: blood pressure returned to baseline and stable Postop Assessment: no apparent nausea or vomiting Anesthetic complications: no   No notable events documented.  Last Vitals:  Vitals:   03/16/22 1600 03/16/22 1700  BP: 128/82 122/77  Pulse: 79 81  Resp: 17 (!) 25  Temp:    SpO2: 97% 96%    Last Pain:  Vitals:   03/16/22 1311  TempSrc:   PainSc: 0-No pain                 Santa Lighter

## 2022-03-20 ENCOUNTER — Ambulatory Visit: Payer: No Typology Code available for payment source | Admitting: Internal Medicine

## 2022-04-06 ENCOUNTER — Ambulatory Visit (INDEPENDENT_AMBULATORY_CARE_PROVIDER_SITE_OTHER): Payer: No Typology Code available for payment source | Admitting: Neurology

## 2022-04-06 DIAGNOSIS — E669 Obesity, unspecified: Secondary | ICD-10-CM

## 2022-04-06 DIAGNOSIS — Z9189 Other specified personal risk factors, not elsewhere classified: Secondary | ICD-10-CM

## 2022-04-06 DIAGNOSIS — I4892 Unspecified atrial flutter: Secondary | ICD-10-CM

## 2022-04-06 DIAGNOSIS — R0683 Snoring: Secondary | ICD-10-CM

## 2022-04-06 DIAGNOSIS — G4719 Other hypersomnia: Secondary | ICD-10-CM

## 2022-04-06 DIAGNOSIS — R351 Nocturia: Secondary | ICD-10-CM

## 2022-04-06 DIAGNOSIS — G4720 Circadian rhythm sleep disorder, unspecified type: Secondary | ICD-10-CM

## 2022-04-06 DIAGNOSIS — G4733 Obstructive sleep apnea (adult) (pediatric): Secondary | ICD-10-CM

## 2022-04-15 NOTE — Procedures (Signed)
Physician Interpretation:     [facility] POLYSOMNOGRAPHY  INTERPRETATION REPORT   STUDY DATE:  04/06/2022     PATIENT NAME:  [name]         DATE OF BIRTH:  Jul 12, 1960  PATIENT ID:  [id number]    TYPE OF STUDY:  PSG  READING PHYSICIAN: Star Age, MD, PhD   SCORING TECHNICIAN: Gaylyn Cheers, RPSGT  Referred by: Ginger Organ., MD   History and Indication for Testing: 62 year old male with a history of arthritis status post lumbar surgery with microdiscectomy, ORIF right shoulder secondary to fracture, right total hip arthroplasty in 2021, eczema, nephrolithiasis, status post lithotripsy, paroxysmal atrial flutter,?status post cardioversion in 2021 and s/p ablation on 03/16/22, status post cataract extractions bilaterally in 2023?and obesity, who reports snoring and excessive daytime somnolence. His Epworth sleepiness score is 14 out of 24, fatigue severity score is 37 out of 63. Height: 70 in Weight: 233 lb (BMI 33) Neck Size: 17 in   MEDICATIONS: Tylenol, Vitamin C, Epinephrine (Epi Pen), Mobic, Metamucil, Flomax, Nasacort, Ambien, Ventolin HFA, Tessalon  TECHNICAL DESCRIPTION: A registered sleep technologist was in attendance for the duration of the recording.  Data collection, scoring, video monitoring, and reporting were performed in compliance with the AASM Manual for the Scoring of Sleep and Associated Events; (Hypopnea is scored based on the criteria listed in Section VIII D. 1b in the AASM Manual V2.6 using a 4% oxygen desaturation rule or Hypopnea is scored based on the criteria listed in Section VIII D. 1a in the AASM Manual V2.6 using 3% oxygen desaturation and /or arousal rule).   SLEEP CONTINUITY AND SLEEP ARCHITECTURE:  Lights-out was at 21:56: and lights-on at  05:07:, with a total recording time of 7 hours, 11.5 min. Total sleep time ( TST) was 397.5 minutes with a normal sleep efficiency at 92.1%.    BODY POSITION:  TST was  divided  between the following sleep positions: 24.0% supine;  76.0% lateral;  0% prone. Duration of total sleep and percent of total sleep in their respective position is as follows: supine 95 minutes (24%), non-supine 302 minutes (76%); right 00 minutes (0%), left 302 minutes (76%), and prone 00 minutes (0%).  Total supine REM sleep time was 00 minutes (0% of total REM sleep).  Sleep latency was decreased at 3.0 minutes.  REM sleep latency was normal at 97.5 minutes. Of the total sleep time, the percentage of stage N1 sleep was 5.7%, stage N2 sleep was 70%, which is markedly increased, stage N3 sleep was absent, and REM sleep was 24.5%, which is normal. Wake after sleep onset (WASO) time accounted for 31 minutes with minimal to mild sleep fragmentation noted.   RESPIRATORY MONITORING:  Based on CMS criteria (using a 4% oxygen desaturation rule for scoring hypopneas), there were 86 apneas (82 obstructive; 4 central; 0 mixed), and 24 hypopneas.  Apnea index was 13.0. Hypopnea index was 3.6. The apnea-hypopnea index was 16.6 overall (55.9 supine, 1 non-supine; 1.2 REM, 0.0 supine REM).  There were 0 respiratory effort-related arousals (RERAs).  The RERA index was 0 events/h. Total respiratory disturbance index (RDI) was 16.6 events/h. RDI results showed: supine RDI  55.9 /h; non-supine RDI 4.2 /h; REM RDI 1.2 /h, supine REM RDI 0.0 /h.   Based on AASM criteria (using a 3% oxygen desaturation and /or arousal rule for scoring hypopneas), there were 86 apneas (82 obstructive; 4 central; 0 mixed), and 55 hypopneas. Apnea index was 13.0.  Hypopnea index was 8.3. The apnea-hypopnea index was 21.3/hour overall (67.2/hour supine, 2 non-supine; 1.8 REM, 0.0 supine REM).  There were 0 respiratory effort-related arousals (RERAs).  The RERA index was 0 events/h. Total respiratory disturbance index (RDI) was 21.3 events/h. RDI results showed: supine RDI  67.2 /h; non-supine RDI 6.8 /h; REM RDI 1.8 /h, supine REM RDI 0.0  /h.   OXIMETRY: Oxyhemoglobin Saturation Nadir during sleep was at  87%) from a mean of 95%.  Of the Total sleep time (TST)   hypoxemia (=<88%) was present for  0.3 minutes, or 0.1% of total sleep time.   LIMB MOVEMENTS: There were 0 periodic limb movements of sleep (0.0/hr), of which 0 (0.0/hr) were associated with an arousal.  AROUSAL: There were 116 arousals in total, for an arousal index of 18 arousals/hour.  Of these, 60 were identified as respiratory-related arousals (9 /h), 0 were PLM-related arousals (0 /h), and 66 were non-specific arousals (10 /h).  EEG: Review of the EEG showed no abnormal electrical discharges and symmetrical bihemispheric findings.    EKG: The EKG revealed normal sinus rhythm (NSR). The average heart rate during sleep was 58 bpm.   AUDIO/VIDEO REVIEW: The audio and video review did not show any abnormal or unusual behaviors, movements, phonations or vocalizations. The patient took  one restroom break. Snoring was mild to moderate.  POST-STUDY QUESTIONNAIRE: Post study, the patient indicated, that sleep was the same as usual.   IMPRESSION:  1. Obstructive Sleep Apnea (OSA) 2. Dysfunctions associated with sleep stages or arousal from sleep  RECOMMENDATIONS:  1. This study demonstrates moderate to severe obstructive sleep apnea, with a total AHI of 21.3/hour, and supine AHI of 67.2/h, O2 nadir 87%.  Of note, the absence of supine REM sleep may have led to some underestimation of his sleep disordered breathing.  Treatment with a positive airway pressure device such as CPAP or AutoPap is recommended.  The patient will be advised to proceed with home AutoPap therapy for now. A full night titration study can be considered if need be in the future, to optimize therapy settings, mask fit, help with tolerance, and to monitor proper oxygen saturation. Other treatment options may be limited, and may include (generally speaking) surgical options in selected patients or the use  of an oral appliance in certain patients. Concomitant weight loss is recommended. Please note that untreated obstructive sleep apnea may carry additional perioperative morbidity. Patients with significant obstructive sleep apnea should receive perioperative PAP therapy and the surgeons and particularly the anesthesiologist should be informed of the diagnosis and the severity of the sleep disordered breathing. 2. This study shows some sleep fragmentation and abnormal sleep stage percentages; these are nonspecific findings and per se do not signify an intrinsic sleep disorder or a cause for the patient's sleep-related symptoms. Causes include (but are not limited to) the first night effect of the sleep study, circadian rhythm disturbances, medication effect or an underlying mood disorder or medical problem.  3. The patient should be cautioned not to drive, work at heights, or operate dangerous or heavy equipment when tired or sleepy. Review and reiteration of good sleep hygiene measures should be pursued with any patient. 4. The patient will be seen in follow-up in the sleep clinic at [facility] for discussion of the test results, symptom and treatment compliance review, further management strategies, etc. The patient and his referring provider will be notified of the test results.   I certify that I have reviewed the entire raw  data recording prior to the issuance of this report in accordance with the Standards of Accreditation of the American Academy of Sleep Medicine (AASM).  Star Age, MD, PhD Medical Director, [place] [facility] at [facility]) [place], [place] (Neurology and Sleep)              Technical Report:   General Information  Name: [name], [name] BMI: 33.43 Physician: Star Age, MD  ID: [id number] Height: 70.0 in Technician: Gaylyn Cheers, RPSGT  Sex: Male Weight: 233.0 lb Record: [id number]  Age: 75 [03-23-1960] Date: 04/06/2022    Medical & Medication  History    [name] is a 62 year old left-handed gentleman with an underlying medical history of arthritis status post lumbar surgery with microdiscectomy, ORIF right shoulder secondary to fracture, right total hip arthroplasty in 2021, eczema, nephrolithiasis, status post lithotripsy, paroxysmal atrial flutter, status post cardioversion in 2021, status post cataract extractions bilaterally in 2023 and obesity, who reports snoring and excessive daytime somnolence. His Epworth sleepiness score is 14 out of 24, fatigue severity score is 37 out of 63.  Tylenol, Vitamin C, Epinephrine (Epi Pen), Mobic, Metamucil, Flomax, Nasacort, Ambien, Ventolin HFA, Tessalon   Sleep Disorder      Comments   Patient arrived for a diagnostic polysomnogram. Procedure explained and all questions answered. Standard paste setup without complications. Patient slept supine, right, and left. Mild to moderate snoring was heard. Frequent respiratory events observed, primarily while supine. After two hours total sleep time, AHI was less than 40. No obvious cardiac arrhythmias noted. No significant PLMS observed. Patient had one restroom visit.    Lights out: 09:56:10 PM Lights on: 05:07:28 AM   Time Total Supine Side Prone Upright  Recording (TRT) 7h 11.54m1h 40.5767mh 31.67m67m 0.67m 72m0.67m  359mep (TST) 6h 37.59m 1h7m.59m 5h 34mm 0h 0767m 0h 0.267m  Late91m N1 N2 N3 REM Onset Per. Slp. Eff.  Actual 0h 0.67m 0h 2.67m2m 0.67m 78m37.59m 71m3.67m 0h73m67m 92.667m   [id number] Wake N1 N2 N3 REM  Total 34.0 22.5 277.5 0.0 97.5  Supine 5.0 6.5 89.0 0.0 0.0  Side 29.0 16.0 188.5 0.0 97.5  Prone 0.0 0.0 0.0 0.0 0.0  Upright 0.0 0.0 0.0 0.0 0.0   Stg % Wake N1 N2 N3 REM  Total 7.9 5.7 69.8 0.0 24.5  Supine 1.2 1.6 22.4 0.0 0.0  Side 6.7 4.0 47.4 0.0 24.5  Prone 0.0 0.0 0.0 0.0 0.0  Upright 0.0 0.0 0.0 0.0 0.0     Apnea Summary Sub Supine Side Prone Upright  Total 86 Total 86 68 18 0 0    REM 1 0 1 0 0    NREM 85 68 17 0 0  Obs 82  REM 0 0 0 0 0    NREM 82 65 17 0 0  Mix 0 REM 0 0 0 0 0    NREM 0 0 0 0 0  Cen 4 REM 1 0 1 0 0    NREM 3 3 0 0 0   Rera Summary Sub Supine Side Prone Upright  Total 0 Total 0 0 0 0 0    REM 0 0 0 0 0    NREM 0 0 0 0 0   Hypopnea Summary Sub Supine Side Prone Upright  Total 55 Total 55 39 16 0 0    REM 2 0 2 0 0    NREM 53 39 14 0 0   4%  Hypopnea Summary Sub Supine Side Prone Upright  Total (4%) 24 Total '24 21 3 '$ 0 0    REM 1 0 1 0 0    NREM '23 21 2 '$ 0 0     AHI Total Obs Mix Cen  21.28 Apnea 12.98 12.38 0.00 0.60   Hypopnea 8.30 -- -- --  16.60 Hypopnea (4%) 3.62 -- -- --    Total Supine Side Prone Upright  Position AHI 21.28 67.23 6.75 0.00 0.00  REM AHI 1.85   NREM AHI 27.60   Position RDI 21.28 67.23 6.75 0.00 0.00  REM RDI 1.85   NREM RDI 27.60    4% Hypopnea Total Supine Side Prone Upright  Position AHI (4%) 16.60 55.92 4.17 0.00 0.00  REM AHI (4%) 1.23   NREM AHI (4%) 21.60   Position RDI (4%) 16.60 55.92 4.17 0.00 0.00  REM RDI (4%) 1.23   NREM RDI (4%) 21.60    Desaturation Information Threshold: 2% <100% <90% <80% <70% <60% <50% <40%  Supine 112.0 6.0 0.0 0.0 0.0 0.0 0.0  Side 128.0 2.0 0.0 0.0 0.0 0.0 0.0  Prone 0.0 0.0 0.0 0.0 0.0 0.0 0.0  Upright 0.0 0.0 0.0 0.0 0.0 0.0 0.0  Total 240.0 8.0 0.0 0.0 0.0 0.0 0.0  Index 33.6 1.1 0.0 0.0 0.0 0.0 0.0   Threshold: 3% <100% <90% <80% <70% <60% <50% <40%  Supine 80.0 6.0 0.0 0.0 0.0 0.0 0.0  Side 31.0 2.0 0.0 0.0 0.0 0.0 0.0  Prone 0.0 0.0 0.0 0.0 0.0 0.0 0.0  Upright 0.0 0.0 0.0 0.0 0.0 0.0 0.0  Total 111.0 8.0 0.0 0.0 0.0 0.0 0.0  Index 15.6 1.1 0.0 0.0 0.0 0.0 0.0   Threshold: 4% <100% <90% <80% <70% <60% <50% <40%  Supine 53.0 6.0 0.0 0.0 0.0 0.0 0.0  Side 11.0 2.0 0.0 0.0 0.0 0.0 0.0  Prone 0.0 0.0 0.0 0.0 0.0 0.0 0.0  Upright 0.0 0.0 0.0 0.0 0.0 0.0 0.0  Total 64.0 8.0 0.0 0.0 0.0 0.0 0.0  Index 9.0 1.1 0.0 0.0 0.0 0.0 0.0   Threshold: 3% <100% <90% <80% <70% <60% <50% <40%  Supine 80 6 0 0  0 0 0  Side 31 2 0 0 0 0 0  Prone 0 0 0 0 0 0 0  Upright 0 0 0 0 0 0 0  Total 111 8 0 0 0 0 0   Awakening/Arousal Information # of Awakenings 17  Wake after sleep onset 31.80m Wake after persistent sleep 30.531m Arousal Assoc. Arousals Index  Apneas 37 5.6  Hypopneas 23 3.5  Leg Movements 1 0.2  Snore 0 0.0  PTT Arousals 0 0.0  Spontaneous 66 10.0  Total 127 19.2  Leg Movement Information PLMS LMs Index  Total LMs during PLMS 0 0.0  LMs w/ Microarousals 0 0.0   LM LMs Index  w/ Microarousal 1 0.2  w/ Awakening 1 0.2  w/ Resp Event 0 0.0  Spontaneous 14 2.1  Total 15 2.3     Desaturation threshold setting: 3% Minimum desaturation setting: 10 seconds SaO2 nadir: 87% The longest event was a 35 sec obstructive Apnea with a minimum SaO2 of 88%. The lowest SaO2 was 87% associated with a 33 sec obstructive Apnea. EKG Rates EKG Avg Max Min  Awake 62 76 51  Asleep 58 77 50  EKG Events: N/A

## 2022-04-15 NOTE — Addendum Note (Signed)
Addended by: Star Age on: 04/15/2022 05:27 PM   Modules accepted: Orders

## 2022-04-16 ENCOUNTER — Telehealth: Payer: Self-pay | Admitting: *Deleted

## 2022-04-16 ENCOUNTER — Encounter: Payer: Self-pay | Admitting: Internal Medicine

## 2022-04-16 ENCOUNTER — Ambulatory Visit: Payer: BC Managed Care – PPO | Attending: Internal Medicine | Admitting: Internal Medicine

## 2022-04-16 VITALS — BP 108/62 | HR 75 | Ht 70.0 in | Wt 231.2 lb

## 2022-04-16 DIAGNOSIS — I483 Typical atrial flutter: Secondary | ICD-10-CM | POA: Diagnosis not present

## 2022-04-16 NOTE — Telephone Encounter (Signed)
Called pt & LVM with office number and today's hours asking for call back.

## 2022-04-16 NOTE — Telephone Encounter (Signed)
-----   Message from Star Age, MD sent at 04/15/2022  5:27 PM EST ----- Patient referred by Dr. Brigitte Pulse, seen by me on 9/28/2, diagnostic PSG on 04/06/22.    Please call and notify the patient that the recent sleep study showed moderate obstructive sleep apnea.  I recommend treatment in the form of AutoPap therapy, which means, that we don't have to bring him back for a second sleep study with CPAP, but will let him try an autoPAP machine at home, through a DME company (of his choice, or as per insurance requirement). The DME representative will educate him on how to use the machine, how to put the mask on, etc. I have placed an order in the chart. Please send referral, talk to patient, send report to referring MD. We will need a FU in sleep clinic for 10 weeks post-PAP set up, please arrange that with me or one of our NPs. Thanks,   Star Age, MD, PhD [facility])

## 2022-04-16 NOTE — Progress Notes (Signed)
HPI [name] returns today for evaluation of atrial flutter, s/p catheter ablation. He is a pleasant 62 yo man who developed atrial flutter 2 years ago and underwent DCCV. He felt better. He developed recurrent flutter and underwent catheter ablation. He feels well. His wife notes that his peripheral edema resolved about 3 days after the ablation. No syncope. No palpitations. Allergies  Allergen Reactions   Tape Rash     Current Outpatient Medications  Medication Sig Dispense Refill   Ascorbic Acid (VITAMIN C) 1000 MG tablet Take 1,000 mg by mouth daily.     Carboxymethylcell-Glycerin PF (REFRESH RELIEVA PF) 0.5-1 % SOLN Place 1 drop into both eyes daily as needed (dry eyes).     EPINEPHrine (EPIPEN 2-PAK) 0.3 mg/0.3 mL IJ SOAJ injection Inject 0.3 mg into the muscle as needed for anaphylaxis.     Lifitegrast (XIIDRA) 5 % SOLN Place 1 drop into both eyes in the morning and at bedtime.     meloxicam (MOBIC) 15 MG tablet Take 15 mg by mouth daily as needed for pain.     methocarbamol (ROBAXIN) 500 MG tablet Take 500 mg by mouth every 6 (six) hours as needed for muscle spasms.     tamsulosin (FLOMAX) 0.4 MG CAPS capsule Take 0.4 mg by mouth at bedtime.     triamcinolone ointment (KENALOG) 0.5 % Apply 1 Application topically 2 (two) times daily as needed (eczema).     zolpidem (AMBIEN) 10 MG tablet Take 10 mg by mouth at bedtime as needed for sleep.     No current facility-administered medications for this visit.     Past Medical History:  Diagnosis Date   Arthritis    back   Atrial flutter ([place])    Has medication for this as needed.   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   Past Surgical History:  Procedure Laterality Date   A-FLUTTER ABLATION  N/A 03/16/2022   Procedure: A-FLUTTER ABLATION;  Surgeon: Evans Lance, MD;  Location: [place] [facility];  Service: Cardiovascular;  Laterality: N/A;   BUBBLE STUDY  12/05/2019   Procedure: BUBBLE STUDY;  Surgeon: Pixie Casino, MD;  Location: [place];  Service: Cardiovascular;;   CARDIOVERSION N/A 12/05/2019   Procedure: CARDIOVERSION;  Surgeon: Pixie Casino, MD;  Location: [facility];  Service: Cardiovascular;  Laterality: N/A;   CATARACT EXTRACTION Bilateral    03/2021   COLONOSCOPY     HEMANGIOMA EXCISION     Dr. [name]   LAMINECTOMY AND MICRODISCECTOMY LUMBAR SPINE  1999   L5-S1   LITHOTRIPSY     2002   ORIF SHOULDER FRACTURE Right 11/29/2018   Procedure: RIGHT OPEN REDUCTION INTERNAL FIXATION (ORIF) SHOULDER FRACTURE;  Surgeon: [name], MD;  Location: [place];  Service: Orthopedics;  Laterality: Right;   TEE WITHOUT CARDIOVERSION N/A 12/05/2019   Procedure: TRANSESOPHAGEAL ECHOCARDIOGRAM (TEE);  Surgeon: Pixie Casino, MD;  Location: [facility] ENDOSCOPY;  Service: Cardiovascular;  Laterality: N/A;   TOTAL HIP ARTHROPLASTY Right 11/24/2019   Procedure: RIGHT TOTAL HIP ARTHROPLASTY ANTERIOR APPROACH;  Surgeon: Mcarthur Rossetti, MD;  Location: [facility];  Service: Orthopedics;  Laterality: Right;     Family  History  Problem Relation Age of Onset   Colon polyps Mother    Other Mother        latent TB   Rheum arthritis Mother    Hypertension Mother    Hyperlipidemia Mother    Hypothyroidism Mother    COPD Mother    Heart disease Mother 44 - 97       s/p MI   Heart failure Father    Renal Disease Father    Diabetes Father    Kidney disease Father    Hypothyroidism Sister    Polycystic ovary syndrome Sister    Cancer Paternal Grandmother    Kidney disease Paternal Grandfather    Colon cancer Neg Hx    Esophageal cancer Neg Hx    Stomach cancer Neg Hx    Rectal cancer Neg Hx    Sleep apnea Neg Hx      Social History   Socioeconomic History    Marital status: Married    Spouse name: Not on file   Number of children: Not on file   Years of education: Not on file   Highest education level: Not on file  Occupational History   Not on file  Tobacco Use   Smoking status: Some Days    Types: Cigars   Smokeless tobacco: Former   Tobacco comments:    occ  Vaping Use   Vaping Use: Never used  Substance and Sexual Activity   Alcohol use: Yes    Alcohol/week: 2.0 standard drinks of alcohol    Types: 2 Standard drinks or equivalent per week    Comment: occasional   Drug use: No   Sexual activity: Not on file  Other Topics Concern   Not on file  Social History Narrative   Not on file   Social Determinants of Health   Financial Resource Strain: Not on file  Food Insecurity: Not on file  Transportation Needs: Not on file  Physical Activity: Not on file  Stress: Not on file  Social Connections: Not on file  Intimate Partner Violence: Not on file     BP 108/62   Pulse 75   Ht '5\' 10"'$  (1.778 m)   Wt 231 lb 3.2 oz (104.9 kg)   SpO2 98%   BMI 33.17 kg/m   Physical Exam:  Well appearing NAD HEENT: Unremarkable Neck:  No JVD, no thyromegally Lymphatics:  No adenopathy Back:  No CVA tenderness Lungs:  Clear with no wheezes HEART:  Regular rate rhythm, no murmurs, no rubs, no clicks Abd:  soft, positive bowel sounds, no organomegally, no rebound, no guarding Ext:  2 plus pulses, no edema, no cyanosis, no clubbing Skin:  No rashes no nodules Neuro:  CN II through XII intact, motor grossly intact  EKG - nsr   Assess/Plan: Atrial flutter - he is s/p catheter ablation and doing well.  Coags - He will stop his systemic anti-coagulation.  Carleene Overlie Aidyn Kellis,MD

## 2022-04-16 NOTE — Telephone Encounter (Signed)
Pt returned my call. We discussed his sleep study results as noted below by Dr Rexene Alberts. Patient is amenable to starting autopap therapy for moderate OSA. We discussed the insurance compliance requirements which includes using the machine at least 4 hours at night and also being seen in our office for an initial follow-up appointment between 30 and 90 days after setup. The patient's questions were answered. He did not have a preference for DME other than in-network with insurance. Patient has been referred to [facility]. Received a receipt of confirmation. Patient schedule an appt for 06/24/22 at 830 AM. He was given [facility]'s address and phone number and will contact them if he hasn't heard within 1 week. He was appreciative.    Sleep study results sent to PCP.

## 2022-04-16 NOTE — Patient Instructions (Addendum)
Medication Instructions:  Your physician has recommended you make the following change in your medication: STOP taking: Eliquis;  Today, 04/16/2022  Lab Work: None ordered.  If you have labs (blood work) drawn today and your tests are completely normal, you will receive your results only by: [place] (if you have MyChart) OR A paper copy in the mail If you have any lab test that is abnormal or we need to change your treatment, we will call you to review the results.  Testing/Procedures: None ordered.  Follow-Up: At [facility], you and your health needs are our priority.  As part of our continuing mission to provide you with exceptional heart care, we have created designated Provider Care Teams.  These Care Teams include your primary Cardiologist (physician) and Advanced Practice Providers (APPs -  Physician Assistants and Nurse Practitioners) who all work together to provide you with the care you need, when you need it.  We recommend signing up for the patient portal called "MyChart".  Sign up information is provided on this After Visit Summary.  MyChart is used to connect with patients for Virtual Visits (Telemedicine).  Patients are able to view lab/test results, encounter notes, upcoming appointments, etc.  Non-urgent messages can be sent to your provider as well.   To learn more about what you can do with MyChart, go to [email or web address].    Your next appointment:   AS NEEDED   The format for your next appointment:   In Person  Provider:   Cristopher Peru, MD{or one of the following Advanced Practice Providers on your designated Care Team:   Tommye Standard, [place] Legrand Como "[facility]" [place], [place]

## 2022-04-30 ENCOUNTER — Telehealth: Payer: Self-pay | Admitting: Neurology

## 2022-04-30 NOTE — Telephone Encounter (Signed)
Pt called and stated he is doing a quiz online for his CPAP machine. Pt said he needs to know how many events he had during his sleep study. Pt is requesting a call back from nurse

## 2022-05-07 ENCOUNTER — Encounter: Payer: Self-pay | Admitting: *Deleted

## 2022-05-07 NOTE — Telephone Encounter (Signed)
Okay to furnish a letter of support to state that patient has obstructive sleep apnea and needs to be able to travel with his CPAP machine, which is a medically recommended treatment device.

## 2022-05-12 NOTE — Telephone Encounter (Signed)
Letter emailed to [email or web address]. Received a receipt of confirmation.

## 2022-06-22 NOTE — Patient Instructions (Signed)

## 2022-06-22 NOTE — Progress Notes (Unsigned)
PATIENT: [name] DOB: August 25, 1960  REASON FOR VISIT: follow up HISTORY FROM: patient  No chief complaint on file.    HISTORY OF PRESENT ILLNESS:  06/22/22 ALL:  [name] is a 62 y.o. male here today for follow up for OSA on CPAP.  He was seen in consult with Dr Frances Furbish 10/2021 for concerns of snoring and daytime somnolence. PSG 03/2022 showed severe OSA with total AHI of 21.3/hr, supine AHI 67.2/hr and O2 nadir of 87%. AutoPAP was advised.     HISTORY: (copied from Dr Teofilo Pod previous note)  Dear Dr. Clelia Croft,    I saw your patient, [name], upon your kind request in my sleep clinic today for initial consultation of his sleep disorder, in particular, concern for underlying obstructive sleep apnea.  The patient is unaccompanied today.  As you know, [name] is a 62 year old left-handed (corrected on 11/13/2021) gentleman with an underlying medical history of arthritis status post lumbar surgery with microdiscectomy, ORIF right shoulder secondary to fracture, right total hip arthroplasty in 2021, eczema, nephrolithiasis, status post lithotripsy, paroxysmal atrial flutter, status post cardioversion in 2021, status post cataract extractions bilaterally in 2023 and obesity, who reports snoring and excessive daytime somnolence.  I reviewed your office note from 11/06/2021.  His Epworth sleepiness score is 14 out of 24, fatigue severity score is 37 out of 63.  He typically sees Dr. Anne Fu in cardiology.  He has a consultation appointment with Dr. Sharrell Ku for possible application for chronic atrial flutter.  He goes to bed at different times, somewhere between 9 PM and 2 AM and rise time is as early as 5 and could be as late as 10 AM.  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  He has a bunny in the house.  He lives with his wife who is an ER physician.  His daughter is grown, works as a Transport planner.  He quit smoking at age 81, never was a heavy smoker.   He drinks alcohol occasionally, up to twice a week.  He drinks caffeine in the form of coffee, about 2 cups in the morning.  He was working as a Chartered loss adjuster, he had shift work.  He denies recurrent morning headaches.  He has nocturia about once per average night, better since he started Flomax.  Weight has been fluctuating within 10 pounds up and down in the past few years.  He is not aware of any family history of sleep apnea.  He does not typically watch TV in his bedroom.   REVIEW OF SYSTEMS: Out of a complete 14 system review of symptoms, the patient complains only of the following symptoms, and all other reviewed systems are negative.  ESS: previously 14/24  ALLERGIES: Allergies  Allergen Reactions   Tape Rash    HOME MEDICATIONS: Outpatient Medications Prior to Visit  Medication Sig Dispense Refill   Ascorbic Acid (VITAMIN C) 1000 MG tablet Take 1,000 mg by mouth daily.     Carboxymethylcell-Glycerin PF (REFRESH RELIEVA PF) 0.5-1 % SOLN Place 1 drop into both eyes daily as needed (dry eyes).     EPINEPHrine (EPIPEN 2-PAK) 0.3 mg/0.3 mL IJ SOAJ injection Inject 0.3 mg into the muscle as needed for anaphylaxis.     Lifitegrast (XIIDRA) 5 % SOLN Place 1 drop into both eyes in the morning and at bedtime.     meloxicam (MOBIC) 15 MG tablet Take 15 mg by mouth  daily as needed for pain.     methocarbamol (ROBAXIN) 500 MG tablet Take 500 mg by mouth every 6 (six) hours as needed for muscle spasms.     tamsulosin (FLOMAX) 0.4 MG CAPS capsule Take 0.4 mg by mouth at bedtime.     triamcinolone ointment (KENALOG) 0.5 % Apply 1 Application topically 2 (two) times daily as needed (eczema).     zolpidem (AMBIEN) 10 MG tablet Take 10 mg by mouth at bedtime as needed for sleep.     No facility-administered medications prior to visit.    PAST MEDICAL HISTORY: Past Medical History:  Diagnosis Date   Arthritis    back   Atrial flutter (HCC)    Has medication for this as needed.   Eczema     History of kidney stones    LTBI (latent tuberculosis infection)    treated (latent) with rifampin, tested positive 30 years ago   Nephrolithiasis    Obesity    Sleep apnea     PAST SURGICAL HISTORY: Past Surgical History:  Procedure Laterality Date   A-FLUTTER ABLATION N/A 03/16/2022   Procedure: A-FLUTTER ABLATION;  Surgeon: Marinus Maw, MD;  Location: [facility] INVASIVE [facility];  Service: Cardiovascular;  Laterality: N/A;   BUBBLE STUDY  12/05/2019   Procedure: BUBBLE STUDY;  Surgeon: Chrystie Nose, MD;  Location: [facility] ENDOSCOPY;  Service: Cardiovascular;;   CARDIOVERSION N/A 12/05/2019   Procedure: CARDIOVERSION;  Surgeon: Chrystie Nose, MD;  Location: [facility] ENDOSCOPY;  Service: Cardiovascular;  Laterality: N/A;   CATARACT EXTRACTION Bilateral    03/2021   COLONOSCOPY     HEMANGIOMA EXCISION     Dr. Amanda Pea   LAMINECTOMY AND MICRODISCECTOMY LUMBAR SPINE  1999   L5-S1   LITHOTRIPSY     2002   ORIF SHOULDER FRACTURE Right 11/29/2018   Procedure: RIGHT OPEN REDUCTION INTERNAL FIXATION (ORIF) SHOULDER FRACTURE;  Surgeon: Cammy Copa, MD;  Location: [facility] OR;  Service: Orthopedics;  Laterality: Right;   TEE WITHOUT CARDIOVERSION N/A 12/05/2019   Procedure: TRANSESOPHAGEAL ECHOCARDIOGRAM (TEE);  Surgeon: Chrystie Nose, MD;  Location: [facility] ENDOSCOPY;  Service: Cardiovascular;  Laterality: N/A;   TOTAL HIP ARTHROPLASTY Right 11/24/2019   Procedure: RIGHT TOTAL HIP ARTHROPLASTY ANTERIOR APPROACH;  Surgeon: Kathryne Hitch, MD;  Location: [facility] ORS;  Service: Orthopedics;  Laterality: Right;    FAMILY HISTORY: Family History  Problem Relation Age of Onset   Colon polyps Mother    Other Mother        latent TB   Rheum arthritis Mother    Hypertension Mother    Hyperlipidemia Mother    Hypothyroidism Mother    COPD Mother    Heart disease Mother 70 - 35       s/p MI   Heart failure Father    Renal Disease Father    Diabetes Father    Kidney disease Father     Hypothyroidism Sister    Polycystic ovary syndrome Sister    Cancer Paternal Grandmother    Kidney disease Paternal Grandfather    Colon cancer Neg Hx    Esophageal cancer Neg Hx    Stomach cancer Neg Hx    Rectal cancer Neg Hx    Sleep apnea Neg Hx     SOCIAL HISTORY: Social History   Socioeconomic History   Marital status: Married    Spouse name: Not on file   Number of children: Not on file   Years of education: Not on file  Highest education level: Not on file  Occupational History   Not on file  Tobacco Use   Smoking status: Some Days    Types: Cigars   Smokeless tobacco: Former   Tobacco comments:    occ  Vaping Use   Vaping Use: Never used  Substance and Sexual Activity   Alcohol use: Yes    Alcohol/week: 2.0 standard drinks of alcohol    Types: 2 Standard drinks or equivalent per week    Comment: occasional   Drug use: No   Sexual activity: Not on file  Other Topics Concern   Not on file  Social History Narrative   Not on file   Social Determinants of Health   Financial Resource Strain: Not on file  Food Insecurity: Not on file  Transportation Needs: Not on file  Physical Activity: Not on file  Stress: Not on file  Social Connections: Not on file  Intimate Partner Violence: Not on file     PHYSICAL EXAM  There were no vitals filed for this visit. There is no height or weight on file to calculate BMI.  Generalized: Well developed, in no acute distress  Cardiology: normal rate and rhythm, no murmur noted Respiratory: clear to auscultation bilaterally  Neurological examination  Mentation: Alert oriented to time, place, history taking. Follows all commands speech and language fluent Cranial nerve II-XII: Pupils were equal round reactive to light. Extraocular movements were full, visual field were full  Motor: The motor testing reveals 5 over 5 strength of all 4 extremities. Good symmetric motor tone is noted throughout.  Gait and station: Gait is  normal.    DIAGNOSTIC DATA (LABS, IMAGING, TESTING) - I reviewed patient records, labs, notes, testing and imaging myself where available.      No data to display           Lab Results  Component Value Date   WBC 6.1 03/16/2022   HGB 16.0 03/16/2022   HCT 45.4 03/16/2022   MCV 86.0 03/16/2022   PLT 230 03/16/2022      Component Value Date/Time   NA 141 03/16/2022 0803   NA 144 02/06/2022 0857   K 3.8 03/16/2022 0803   CL 106 03/16/2022 0803   CO2 25 03/16/2022 0803   GLUCOSE 104 (H) 03/16/2022 0803   BUN 14 03/16/2022 0803   BUN 17 02/06/2022 0857   CREATININE 1.05 03/16/2022 0803   CREATININE 1.00 12/21/2016 1202   CALCIUM 9.0 03/16/2022 0803   PROT 6.8 12/21/2016 1202   AST 21 12/21/2016 1202   ALT 33 12/21/2016 1202   BILITOT 0.5 12/21/2016 1202   GFRNONAA >60 03/16/2022 0803   GFRAA 103 11/29/2019 1015   No results found for: "CHOL", "HDL", "LDLCALC", "LDLDIRECT", "TRIG", "CHOLHDL" No results found for: "HGBA1C" No results found for: "VITAMINB12" No results found for: "TSH"   ASSESSMENT AND PLAN 62 y.o. year old male  has a past medical history of Arthritis, Atrial flutter (HCC), Eczema, History of kidney stones, LTBI (latent tuberculosis infection), Nephrolithiasis, Obesity, and Sleep apnea. here with   No diagnosis found.    Daved K Disney is doing well on CPAP therapy. Compliance report reveals ***. *** was encouraged to continue using CPAP nightly and for greater than 4 hours each night. We will update supply orders as indicated. Risks of untreated sleep apnea review and education materials provided. Healthy lifestyle habits encouraged. *** will follow up in ***, sooner if needed. *** verbalizes understanding and agreement with this plan.  No orders of the defined types were placed in this encounter.    No orders of the defined types were placed in this encounter.     Shawnie Dapper, FNP-C 06/22/2022, 1:45 PM [facility] [address] [place], [place] [address] [phone]

## 2022-06-24 ENCOUNTER — Ambulatory Visit (INDEPENDENT_AMBULATORY_CARE_PROVIDER_SITE_OTHER): Payer: No Typology Code available for payment source | Admitting: Family Medicine

## 2022-06-24 ENCOUNTER — Encounter: Payer: Self-pay | Admitting: Family Medicine

## 2022-06-24 VITALS — BP 130/78 | HR 66 | Ht 70.0 in | Wt 237.8 lb

## 2022-06-24 DIAGNOSIS — G4733 Obstructive sleep apnea (adult) (pediatric): Secondary | ICD-10-CM

## 2022-12-24 ENCOUNTER — Ambulatory Visit: Payer: BLUE CROSS/BLUE SHIELD | Attending: Cardiology | Admitting: Cardiology

## 2022-12-24 ENCOUNTER — Encounter: Payer: Self-pay | Admitting: Cardiology

## 2022-12-24 VITALS — BP 124/78 | HR 70 | Ht 70.0 in | Wt 244.8 lb

## 2022-12-24 DIAGNOSIS — I483 Typical atrial flutter: Secondary | ICD-10-CM

## 2022-12-24 NOTE — Patient Instructions (Signed)

## 2022-12-24 NOTE — Progress Notes (Signed)
  Cardiology Office Note:  .   Date:  12/24/2022  ID:  [name], DOB 1960-06-30, MRN [id number] PCP: Cleatis Polka., MD  [facility] Providers Cardiologist:  Donato Schultz, MD     History of Present Illness: .   [name] is a 62 y.o. male Discussed the use of AI scribe  History of Present Illness   The 62 year old patient, with a history of atrial flutter, underwent a successful catheter ablation procedure on March 16, 2022. Post-procedure EKGs have consistently shown normal sinus rhythm, and the patient has since discontinued systemic anticoagulation. He has been asymptomatic, with no documented issues post-ablation.  In addition to managing his cardiac condition, the patient has been addressing weight concerns with the aid of Zepbound. Despite some dietary indulgences during travels, he has been committed to weight loss efforts.  The patient also started on CPAP therapy shortly after the ablation procedure. This intervention was aimed at managing potential triggers for atrial flutter, such as sleep apnea.  There was a noted improvement in the patient's pulse and a reduction in ankle swelling post-ablation, suggesting an overall improvement in his cardiovascular health.          Studies Reviewed: Marland Kitchen   EKG Interpretation Date/Time:  Thursday December 24 2022 10:01:10 EST Ventricular Rate:  66 PR Interval:  242 QRS Duration:  84 QT Interval:  416 QTC Calculation: 436 R Axis:   45  Text Interpretation: Sinus rhythm with 1st degree A-V block When compared with ECG of 05-Dec-2019 11:47, No significant change was found Confirmed by Donato Schultz ([id number]) on 12/24/2022 10:03:01 AM     Risk Assessment/Calculations:            Physical Exam:   VS:  BP 124/78   Pulse 70   Ht 5\' 10"  (1.778 m)   Wt 244 lb 12.8 oz (111 kg)   SpO2 96%   BMI 35.13 kg/m    Wt Readings from Last 3 Encounters:  12/24/22 244 lb 12.8 oz (111 kg)  06/24/22 237 lb 12.8 oz (107.9  kg)  04/16/22 231 lb 3.2 oz (104.9 kg)    GEN: Well nourished, well developed in no acute distress NECK: No JVD; No carotid bruits CARDIAC: RRR, no murmurs, no rubs, no gallops RESPIRATORY:  Clear to auscultation without rales, wheezing or rhonchi  ABDOMEN: Soft, non-tender, non-distended EXTREMITIES:  No edema; No deformity   ASSESSMENT AND PLAN: .    Assessment and Plan    Atrial Flutter Post catheter ablation on 03/16/2022. Asymptomatic with EKG showing normal sinus rhythm. No recurrence of atrial flutter noted. -Obtain EKG today for further evaluation. -Plan for annual follow-up to monitor for any recurrence. -Mild 1st degree AVB  Obesity On Zepbound for weight loss. No specific issues noted. -Continue Zepbound as prescribed.  Sleep Apnea Started on CPAP therapy post-ablation. No specific issues noted. -Continue CPAP therapy as prescribed.             Dispo: 1 yr  Signed, Donato Schultz, MD

## 2023-06-22 NOTE — Progress Notes (Signed)
 PATIENT: [name] DOB: [birth year]  REASON FOR VISIT: follow up HISTORY FROM: patient  Chief Complaint  Patient presents with   Follow-up    Pt in room 1.alone. Here for cpap follow up. Pt reports doing well on cpap. Pt now has bite guard to help also.     HISTORY OF PRESENT ILLNESS:  06/28/23 ALL: [name] returns for follow up for OSA on CPAP. He continues to do well on therapy. He is using CPAp nightly for about 7.5 hours, on average. He denies concerns with machine or supplies. He does note less daytime sleepiness. He is now using an bite guard for clenching. No difficulty with CPAP FFM.     06/24/2022 ALL:   [name] is a 63 y.o. male here today for follow up for OSA on CPAP.  He was seen in consult with Dr Omar Bibber 10/2021 for concerns of snoring and daytime somnolence. PSG 03/2022 showed severe OSA with total AHI of 21.3/hr, supine AHI 67.2/hr and O2 nadir of 87%. AutoPAP was advised. He reports doing well. He is using therapy nightly. He denies concerns with machine or supplies. He does note improved sleep quality. He feels less sleepy during the day. He thinks snoring has improved. He is feeling well today and without concerns.      HISTORY: (copied from Dr Dail Drought previous note)  Dear Dr. Bernetta Brilliant,    I saw your patient, [name], upon your kind request in my sleep clinic today for initial consultation of his sleep disorder, in particular, concern for underlying obstructive sleep apnea.  The patient is unaccompanied today.  As you know, [name] is a 63 year old left-handed (corrected on 11/13/2021) gentleman with an underlying medical history of arthritis status post lumbar surgery with microdiscectomy, ORIF right shoulder secondary to fracture, right total hip arthroplasty in 2021, eczema, nephrolithiasis, status post lithotripsy, paroxysmal atrial flutter, status post cardioversion in 2021, status post cataract extractions bilaterally in 2023 and obesity, who  reports snoring and excessive daytime somnolence.  I reviewed your office note from 11/06/2021.  His Epworth sleepiness score is 14 out of 24, fatigue severity score is 37 out of 63.  He typically sees Dr. Renna Cary in cardiology.  He has a consultation appointment with Dr. Jalene Mayor for possible application for chronic atrial flutter.  He goes to bed at different times, somewhere between 9 PM and 2 AM and rise time is as early as 5 and could be as late as 10 AM.  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  He has a bunny in the house.  He lives with his wife who is an ER physician.  His daughter is grown, works as a Transport planner.  He quit smoking at age 54, never was a heavy smoker.  He drinks alcohol occasionally, up to twice a week.  He drinks caffeine in the form of coffee, about 2 cups in the morning.  He was working as a Chartered loss adjuster, he had shift work.  He denies recurrent morning headaches.  He has nocturia about once per average night, better since he started Flomax.  Weight has been fluctuating within 10 pounds up and down in the past few years.  He is not aware of any family history of sleep apnea.  He does not typically watch TV in his bedroom.   REVIEW OF SYSTEMS: Out of a complete 14 system review of symptoms, the patient complains only of  the following symptoms, none and all other reviewed systems are negative.  ESS: 6/24, previously 4/24  ALLERGIES: Allergies  Allergen Reactions   Tape Rash    HOME MEDICATIONS: Outpatient Medications Prior to Visit  Medication Sig Dispense Refill   Ascorbic Acid (VITAMIN C) 1000 MG tablet Take 1,000 mg by mouth daily.     Carboxymethylcell-Glycerin PF (REFRESH RELIEVA PF) 0.5-1 % SOLN Place 1 drop into both eyes daily as needed (dry eyes).     EPINEPHrine (EPIPEN 2-PAK) 0.3 mg/0.3 mL IJ SOAJ injection Inject 0.3 mg into the muscle as needed for anaphylaxis.     meloxicam (MOBIC) 15 MG tablet Take 15 mg by mouth daily as  needed for pain.     methocarbamol  (ROBAXIN ) 500 MG tablet Take 500 mg by mouth every 6 (six) hours as needed for muscle spasms.     tamsulosin (FLOMAX) 0.4 MG CAPS capsule Take 0.4 mg by mouth at bedtime.     triamcinolone ointment (KENALOG) 0.5 % Apply 1 Application topically 2 (two) times daily as needed (eczema).     ZEPBOUND 15 MG/0.5ML Pen Inject 15 mg into the skin once a week.     zolpidem (AMBIEN) 10 MG tablet Take 10 mg by mouth at bedtime as needed for sleep.     ZEPBOUND 2.5 MG/0.5ML Pen Inject 2.5 mg into the skin once a week.     No facility-administered medications prior to visit.    PAST MEDICAL HISTORY: Past Medical History:  Diagnosis Date   Arthritis    back   Atrial flutter (HCC)    Has medication for this as needed.   Eczema    History of kidney stones    LTBI (latent tuberculosis infection)    treated (latent) with rifampin , tested positive 30 years ago   Nephrolithiasis    Obesity    Sleep apnea     PAST SURGICAL HISTORY: Past Surgical History:  Procedure Laterality Date   A-FLUTTER ABLATION N/A 03/16/2022   Procedure: A-FLUTTER ABLATION;  Surgeon: Tammie Fall, MD;  Location: [facility] INVASIVE CV LAB;  Service: Cardiovascular;  Laterality: N/A;   BUBBLE STUDY  12/05/2019   Procedure: BUBBLE STUDY;  Surgeon: Hazle Lites, MD;  Location: [facility] ENDOSCOPY;  Service: Cardiovascular;;   CARDIOVERSION N/A 12/05/2019   Procedure: CARDIOVERSION;  Surgeon: Hazle Lites, MD;  Location: [facility] ENDOSCOPY;  Service: Cardiovascular;  Laterality: N/A;   CATARACT EXTRACTION Bilateral    03/2021   COLONOSCOPY     HEMANGIOMA EXCISION     Dr. Aloha Arnold   LAMINECTOMY AND MICRODISCECTOMY LUMBAR SPINE  1999   L5-S1   LITHOTRIPSY     2002   ORIF SHOULDER FRACTURE Right 11/29/2018   Procedure: RIGHT OPEN REDUCTION INTERNAL FIXATION (ORIF) SHOULDER FRACTURE;  Surgeon: Jasmine Mesi, MD;  Location: [facility] OR;  Service: Orthopedics;  Laterality: Right;   TEE WITHOUT  CARDIOVERSION N/A 12/05/2019   Procedure: TRANSESOPHAGEAL ECHOCARDIOGRAM (TEE);  Surgeon: Hazle Lites, MD;  Location: [facility] ENDOSCOPY;  Service: Cardiovascular;  Laterality: N/A;   TOTAL HIP ARTHROPLASTY Right 11/24/2019   Procedure: RIGHT TOTAL HIP ARTHROPLASTY ANTERIOR APPROACH;  Surgeon: Arnie Lao, MD;  Location: [facility] ORS;  Service: Orthopedics;  Laterality: Right;    FAMILY HISTORY: Family History  Problem Relation Age of Onset   Colon polyps Mother    Other Mother        latent TB   Rheum arthritis Mother    Hypertension Mother    Hyperlipidemia Mother  Hypothyroidism Mother    COPD Mother    Heart disease Mother 53 - 35       s/p MI   Heart failure Father    Renal Disease Father    Diabetes Father    Kidney disease Father    Hypothyroidism Sister    Polycystic ovary syndrome Sister    Cancer Paternal Grandmother    Kidney disease Paternal Grandfather    Colon cancer Neg Hx    Esophageal cancer Neg Hx    Stomach cancer Neg Hx    Rectal cancer Neg Hx    Sleep apnea Neg Hx     SOCIAL HISTORY: Social History   Socioeconomic History   Marital status: Married    Spouse name: Not on file   Number of children: Not on file   Years of education: Not on file   Highest education level: Not on file  Occupational History   Not on file  Tobacco Use   Smoking status: Some Days    Types: Cigars   Smokeless tobacco: Former   Tobacco comments:    occ  Vaping Use   Vaping status: Never Used  Substance and Sexual Activity   Alcohol use: Yes    Alcohol/week: 2.0 standard drinks of alcohol    Types: 2 Standard drinks or equivalent per week    Comment: occasional   Drug use: No   Sexual activity: Not on file  Other Topics Concern   Not on file  Social History Narrative   Not on file   Social Drivers of Health   Financial Resource Strain: Not on file  Food Insecurity: Not on file  Transportation Needs: Not on file  Physical Activity: Not on file   Stress: Not on file  Social Connections: Not on file  Intimate Partner Violence: Not on file     PHYSICAL EXAM  Vitals:   06/28/23 0925  BP: 125/82  Pulse: 74  SpO2: 96%  Weight: 230 lb 8 oz (104.6 kg)  Height: 5\' 10"  (1.778 m)    Body mass index is 33.07 kg/m.  Generalized: Well developed, in no acute distress  Cardiology: normal rate and rhythm, no murmur noted Respiratory: clear to auscultation bilaterally  Neurological examination  Mentation: Alert oriented to time, place, history taking. Follows all commands speech and language fluent Cranial nerve II-XII: Pupils were equal round reactive to light. Extraocular movements were full, visual field were full  Motor: The motor testing reveals 5 over 5 strength of all 4 extremities. Good symmetric motor tone is noted throughout.  Gait and station: Gait is normal.    DIAGNOSTIC DATA (LABS, IMAGING, TESTING) - I reviewed patient records, labs, notes, testing and imaging myself where available.      No data to display           Lab Results  Component Value Date   WBC 6.1 03/16/2022   HGB 16.0 03/16/2022   HCT 45.4 03/16/2022   MCV 86.0 03/16/2022   PLT 230 03/16/2022      Component Value Date/Time   NA 141 03/16/2022 0803   NA 144 02/06/2022 0857   K 3.8 03/16/2022 0803   CL 106 03/16/2022 0803   CO2 25 03/16/2022 0803   GLUCOSE 104 (H) 03/16/2022 0803   BUN 14 03/16/2022 0803   BUN 17 02/06/2022 0857   CREATININE 1.05 03/16/2022 0803   CREATININE 1.00 12/21/2016 1202   CALCIUM 9.0 03/16/2022 0803   PROT 6.8 12/21/2016 1202  AST 21 12/21/2016 1202   ALT 33 12/21/2016 1202   BILITOT 0.5 12/21/2016 1202   GFRNONAA >60 03/16/2022 0803   GFRAA 103 11/29/2019 1015   No results found for: "CHOL", "HDL", "LDLCALC", "LDLDIRECT", "TRIG", "CHOLHDL" No results found for: "HGBA1C" No results found for: "VITAMINB12" No results found for: "TSH"   ASSESSMENT AND PLAN 63 y.o. year old male  has a past medical  history of Arthritis, Atrial flutter (HCC), Eczema, History of kidney stones, LTBI (latent tuberculosis infection), Nephrolithiasis, Obesity, and Sleep apnea. here with     ICD-10-CM   1. OSA on CPAP  G47.33 For home use only DME continuous positive airway pressure (CPAP)       Veer K Ruan is doing well on CPAP therapy. Compliance report reveals excellent compliance. He was encouraged to continue using CPAP nightly and for greater than 4 hours each night. We will update supply orders as indicated. Risks of untreated sleep apnea review and education materials provided. Healthy lifestyle habits encouraged. He will follow up in 1 year, sooner if needed. He verbalizes understanding and agreement with this plan.    Orders Placed This Encounter  Procedures   For home use only DME continuous positive airway pressure (CPAP)    Heated Humidity with all supplies as needed    Length of Need:   Lifetime    Patient has OSA or probable OSA:   Yes    Is the patient currently using CPAP in the home:   Yes    Settings:   Other see comments    CPAP supplies needed:   Mask, headgear, cushions, filters, heated tubing and water  chamber     No orders of the defined types were placed in this encounter.     Terrilyn Fick, FNP-C 06/28/2023, 9:44 AM [facility] [address], [address] [place], [place] [address] [phone]

## 2023-06-22 NOTE — Patient Instructions (Signed)

## 2023-06-24 NOTE — Progress Notes (Signed)
 [name]

## 2023-06-28 ENCOUNTER — Ambulatory Visit: Payer: No Typology Code available for payment source | Admitting: Family Medicine

## 2023-06-28 ENCOUNTER — Encounter: Payer: Self-pay | Admitting: Family Medicine

## 2023-06-28 VITALS — BP 125/82 | HR 74 | Ht 70.0 in | Wt 230.5 lb

## 2023-06-28 DIAGNOSIS — G4733 Obstructive sleep apnea (adult) (pediatric): Secondary | ICD-10-CM | POA: Diagnosis not present

## 2024-03-20 ENCOUNTER — Other Ambulatory Visit (HOSPITAL_COMMUNITY): Payer: Self-pay | Admitting: Internal Medicine

## 2024-03-20 ENCOUNTER — Ambulatory Visit (HOSPITAL_COMMUNITY): Admission: RE | Admit: 2024-03-20 | Discharge: 2024-03-20 | Attending: Surgery | Admitting: Surgery

## 2024-03-20 DIAGNOSIS — Z0000 Encounter for general adult medical examination without abnormal findings: Secondary | ICD-10-CM

## 2024-06-27 ENCOUNTER — Telehealth: Admitting: Family Medicine
